# Patient Record
Sex: Male | Born: 1950
Health system: Southern US, Community
[De-identification: ages and names within clinical notes are randomized; demographics above are authoritative.]

## PROBLEM LIST (undated history)

## (undated) DIAGNOSIS — C801 Malignant (primary) neoplasm, unspecified: Secondary | ICD-10-CM

## (undated) DIAGNOSIS — E785 Hyperlipidemia, unspecified: Secondary | ICD-10-CM

## (undated) DIAGNOSIS — I10 Essential (primary) hypertension: Secondary | ICD-10-CM

## (undated) DIAGNOSIS — K219 Gastro-esophageal reflux disease without esophagitis: Secondary | ICD-10-CM

## (undated) DIAGNOSIS — J189 Pneumonia, unspecified organism: Secondary | ICD-10-CM

## (undated) HISTORY — PX: TONSILLECTOMY AND ADENOIDECTOMY: SUR1326

## (undated) HISTORY — DX: Gastro-esophageal reflux disease without esophagitis: K21.9

## (undated) HISTORY — PX: UPPER GASTROINTESTINAL ENDOSCOPY: SHX188

## (undated) HISTORY — PX: COLONOSCOPY: SHX174

## (undated) HISTORY — DX: Hyperlipidemia, unspecified: E78.5

## (undated) HISTORY — DX: Essential (primary) hypertension: I10

## (undated) HISTORY — DX: Pneumonia, unspecified organism: J18.9

---
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
## 1975-06-02 DIAGNOSIS — J189 Pneumonia, unspecified organism: Secondary | ICD-10-CM

## 1975-06-02 HISTORY — DX: Pneumonia, unspecified organism: J18.9

## 1985-06-01 HISTORY — PX: VASECTOMY: SHX75

## 1999-07-14 ENCOUNTER — Encounter (INDEPENDENT_AMBULATORY_CARE_PROVIDER_SITE_OTHER): Payer: Self-pay

## 1999-07-14 ENCOUNTER — Other Ambulatory Visit: Admission: RE | Admit: 1999-07-14 | Discharge: 1999-07-14 | Payer: Self-pay | Admitting: Gastroenterology

## 1999-08-25 ENCOUNTER — Other Ambulatory Visit: Admission: RE | Admit: 1999-08-25 | Discharge: 1999-08-25 | Payer: Self-pay | Admitting: Gastroenterology

## 1999-08-25 ENCOUNTER — Encounter (INDEPENDENT_AMBULATORY_CARE_PROVIDER_SITE_OTHER): Payer: Self-pay

## 2003-04-06 ENCOUNTER — Encounter: Payer: Self-pay | Admitting: Internal Medicine

## 2004-06-18 ENCOUNTER — Ambulatory Visit: Payer: Self-pay | Admitting: Internal Medicine

## 2004-10-24 ENCOUNTER — Ambulatory Visit: Payer: Self-pay | Admitting: Internal Medicine

## 2004-11-13 ENCOUNTER — Ambulatory Visit: Payer: Self-pay | Admitting: Gastroenterology

## 2004-11-24 ENCOUNTER — Ambulatory Visit: Payer: Self-pay | Admitting: Gastroenterology

## 2005-04-07 ENCOUNTER — Ambulatory Visit: Payer: Self-pay | Admitting: Internal Medicine

## 2005-12-01 ENCOUNTER — Ambulatory Visit: Payer: Self-pay | Admitting: Internal Medicine

## 2006-06-16 ENCOUNTER — Ambulatory Visit: Payer: Self-pay | Admitting: Internal Medicine

## 2006-10-14 DIAGNOSIS — E785 Hyperlipidemia, unspecified: Secondary | ICD-10-CM

## 2006-10-14 DIAGNOSIS — I10 Essential (primary) hypertension: Secondary | ICD-10-CM | POA: Insufficient documentation

## 2006-10-15 ENCOUNTER — Ambulatory Visit: Payer: Self-pay | Admitting: Internal Medicine

## 2006-10-15 ENCOUNTER — Encounter: Payer: Self-pay | Admitting: Internal Medicine

## 2006-10-15 LAB — CONVERTED CEMR LAB
ALT: 33 units/L (ref 0–40)
AST: 29 units/L (ref 0–37)
BUN: 18 mg/dL (ref 6–23)
Cholesterol: 163 mg/dL (ref 0–200)
HDL: 43.9 mg/dL (ref >39.0)
LDL Cholesterol: 86 mg/dL (ref 0–99)
VLDL: 33 mg/dL (ref 0–40)

## 2006-11-22 ENCOUNTER — Encounter: Payer: Self-pay | Admitting: Internal Medicine

## 2007-01-10 ENCOUNTER — Ambulatory Visit: Payer: Self-pay | Admitting: Internal Medicine

## 2007-01-10 DIAGNOSIS — L719 Rosacea, unspecified: Secondary | ICD-10-CM

## 2007-01-10 DIAGNOSIS — H02849 Edema of unspecified eye, unspecified eyelid: Secondary | ICD-10-CM

## 2007-01-15 LAB — CONVERTED CEMR LAB
Free T4: 0.8 ng/dL
T3, Free: 2.7 pg/mL
TSH: 0.97 u[IU]/mL

## 2007-01-17 ENCOUNTER — Encounter (INDEPENDENT_AMBULATORY_CARE_PROVIDER_SITE_OTHER): Payer: Self-pay | Admitting: *Deleted

## 2007-02-22 ENCOUNTER — Ambulatory Visit: Payer: Self-pay | Admitting: Internal Medicine

## 2007-02-24 LAB — CONVERTED CEMR LAB
Basophils Absolute: 0 10*3/uL (ref 0.0–0.1)
Eosinophils Absolute: 0.2 10*3/uL (ref 0.0–0.6)
Eosinophils Relative: 2.3 % (ref 0.0–5.0)
HCT: 40.4 % (ref 39.0–52.0)
Lymphocytes Relative: 19.5 % (ref 12.0–46.0)
MCHC: 34.3 g/dL (ref 30.0–36.0)
MCV: 88.7 fL (ref 78.0–100.0)
Monocytes Absolute: 0.6 10*3/uL (ref 0.2–0.7)
Neutro Abs: 6.5 10*3/uL (ref 1.4–7.7)
Neutrophils Relative %: 70.6 % (ref 43.0–77.0)
RBC: 4.55 M/uL (ref 4.22–5.81)
WBC: 9.1 10*3/uL (ref 4.5–10.5)

## 2007-02-25 ENCOUNTER — Encounter: Payer: Self-pay | Admitting: Internal Medicine

## 2007-02-25 ENCOUNTER — Encounter (INDEPENDENT_AMBULATORY_CARE_PROVIDER_SITE_OTHER): Payer: Self-pay | Admitting: *Deleted

## 2007-03-07 ENCOUNTER — Telehealth (INDEPENDENT_AMBULATORY_CARE_PROVIDER_SITE_OTHER): Payer: Self-pay | Admitting: *Deleted

## 2007-04-07 ENCOUNTER — Encounter: Payer: Self-pay | Admitting: Internal Medicine

## 2007-05-06 ENCOUNTER — Encounter: Payer: Self-pay | Admitting: Internal Medicine

## 2007-06-28 ENCOUNTER — Ambulatory Visit: Payer: Self-pay | Admitting: Internal Medicine

## 2007-06-28 DIAGNOSIS — T783XXA Angioneurotic edema, initial encounter: Secondary | ICD-10-CM | POA: Insufficient documentation

## 2007-07-02 LAB — CONVERTED CEMR LAB
Basophils Absolute: 0.1 10*3/uL (ref 0.0–0.1)
Basophils Relative: 0.8 % (ref 0.0–1.0)
Complement C4, Body Fluid: 22 mg/dL (ref 16–47)
Eosinophils Absolute: 0.3 10*3/uL (ref 0.0–0.6)
Hemoglobin: 13.8 g/dL (ref 13.0–17.0)
Lymphocytes Relative: 22.2 % (ref 12.0–46.0)
MCHC: 33.5 g/dL (ref 30.0–36.0)
MCV: 86.3 fL (ref 78.0–100.0)
Monocytes Absolute: 0.7 10*3/uL (ref 0.2–0.7)
Monocytes Relative: 9.3 % (ref 3.0–11.0)
Neutro Abs: 5 10*3/uL (ref 1.4–7.7)
Platelets: 344 10*3/uL (ref 150–400)
Sed Rate: 8 mm/hr (ref 0–20)

## 2007-07-04 ENCOUNTER — Encounter (INDEPENDENT_AMBULATORY_CARE_PROVIDER_SITE_OTHER): Payer: Self-pay | Admitting: *Deleted

## 2007-07-05 ENCOUNTER — Ambulatory Visit: Payer: Self-pay | Admitting: Cardiovascular Disease

## 2007-07-07 ENCOUNTER — Encounter (INDEPENDENT_AMBULATORY_CARE_PROVIDER_SITE_OTHER): Payer: Self-pay | Admitting: *Deleted

## 2007-08-16 ENCOUNTER — Encounter: Payer: Self-pay | Admitting: Internal Medicine

## 2008-01-18 ENCOUNTER — Telehealth (INDEPENDENT_AMBULATORY_CARE_PROVIDER_SITE_OTHER): Payer: Self-pay | Admitting: *Deleted

## 2008-06-11 ENCOUNTER — Telehealth (INDEPENDENT_AMBULATORY_CARE_PROVIDER_SITE_OTHER): Payer: Self-pay | Admitting: *Deleted

## 2008-09-25 ENCOUNTER — Telehealth (INDEPENDENT_AMBULATORY_CARE_PROVIDER_SITE_OTHER): Payer: Self-pay | Admitting: *Deleted

## 2008-09-28 ENCOUNTER — Emergency Department (HOSPITAL_COMMUNITY): Admission: EM | Admit: 2008-09-28 | Discharge: 2008-09-28 | Payer: Self-pay | Admitting: Emergency Medicine

## 2008-10-16 ENCOUNTER — Ambulatory Visit: Payer: Self-pay | Admitting: Internal Medicine

## 2008-12-28 ENCOUNTER — Telehealth (INDEPENDENT_AMBULATORY_CARE_PROVIDER_SITE_OTHER): Payer: Self-pay | Admitting: *Deleted

## 2009-01-08 ENCOUNTER — Ambulatory Visit: Payer: Self-pay | Admitting: Internal Medicine

## 2009-01-11 ENCOUNTER — Ambulatory Visit: Payer: Self-pay | Admitting: Internal Medicine

## 2009-01-11 DIAGNOSIS — T887XXA Unspecified adverse effect of drug or medicament, initial encounter: Secondary | ICD-10-CM | POA: Insufficient documentation

## 2009-01-14 ENCOUNTER — Telehealth (INDEPENDENT_AMBULATORY_CARE_PROVIDER_SITE_OTHER): Payer: Self-pay | Admitting: *Deleted

## 2009-01-15 ENCOUNTER — Encounter (INDEPENDENT_AMBULATORY_CARE_PROVIDER_SITE_OTHER): Payer: Self-pay | Admitting: *Deleted

## 2009-07-11 IMAGING — CR DG FOOT COMPLETE 3+V*R*
3 series · 3 of 3 positions shown · non-contrast
Comparison: None.

CLINICAL DATA: Fell off ladder.  Foot pain.

RIGHT FOOT COMPLETE - 3+ VIEW

[t foot ap right]
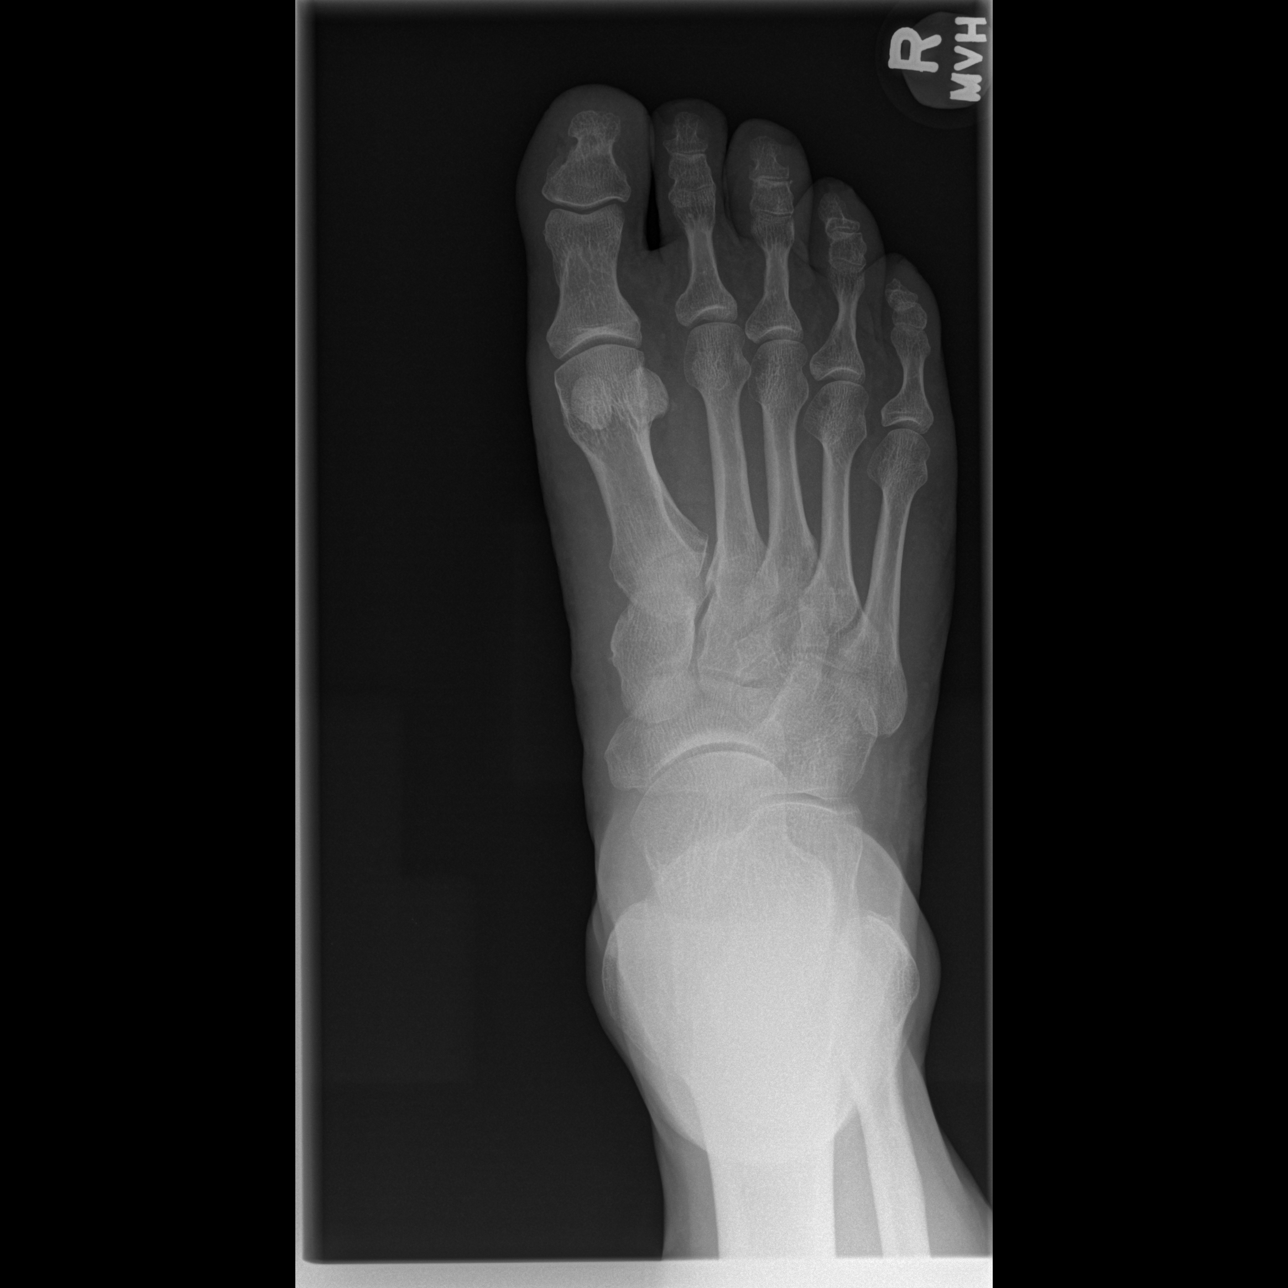

[t foot oblique right]
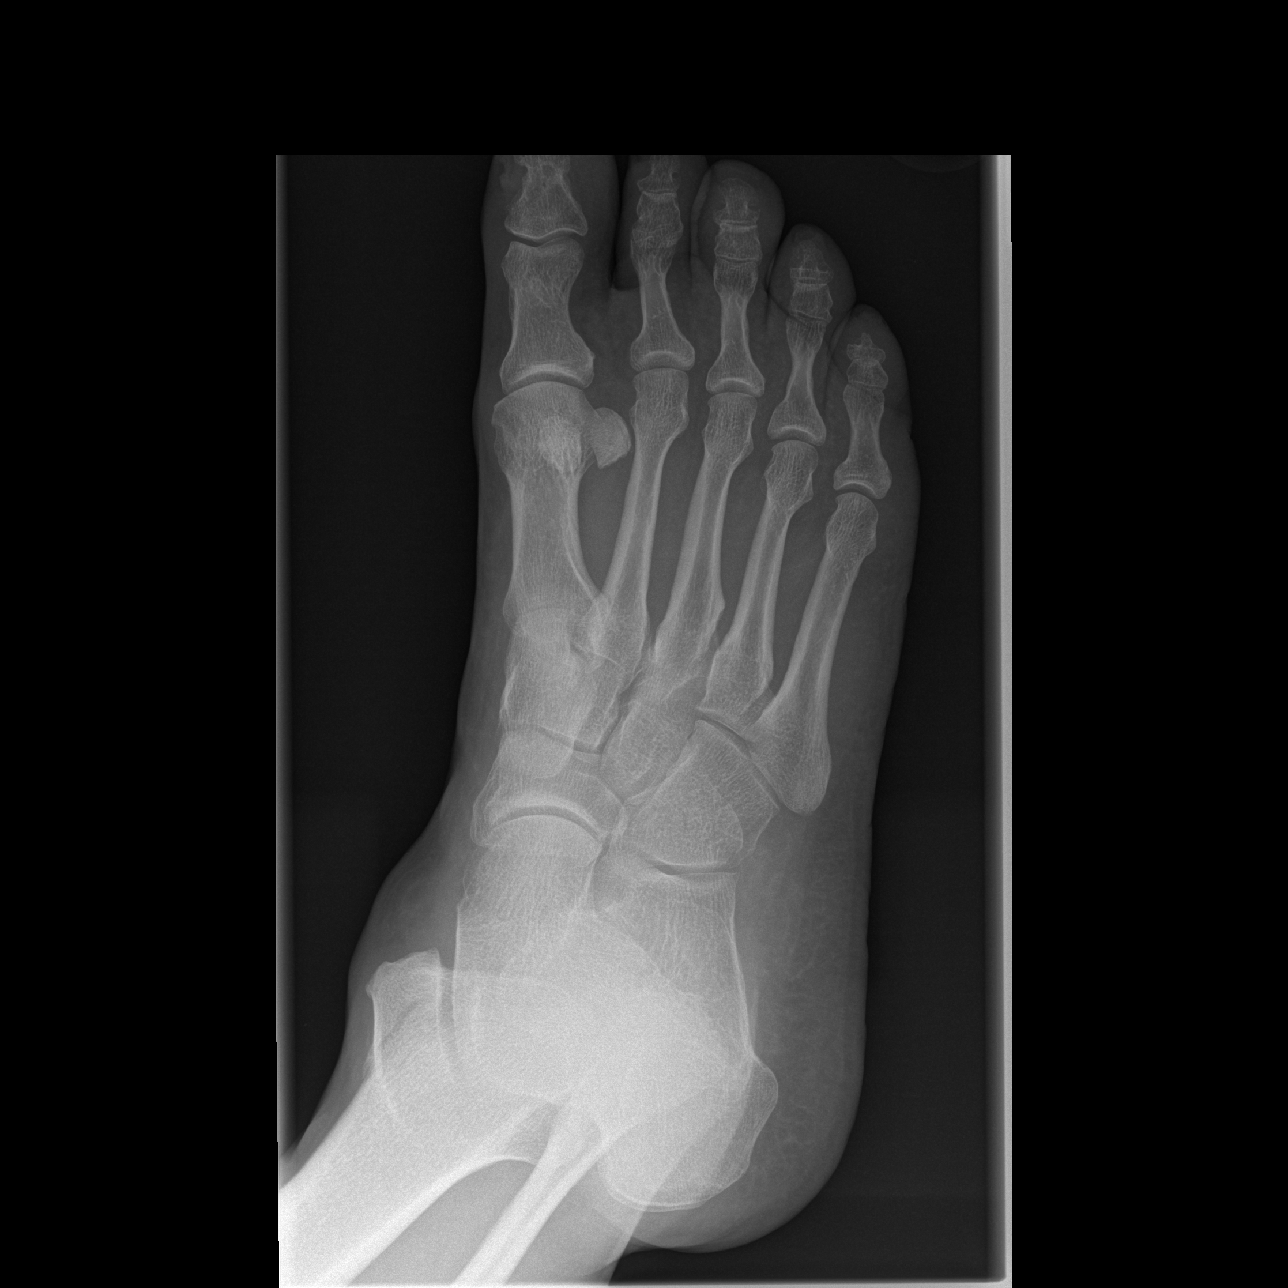

[t foot lat right]
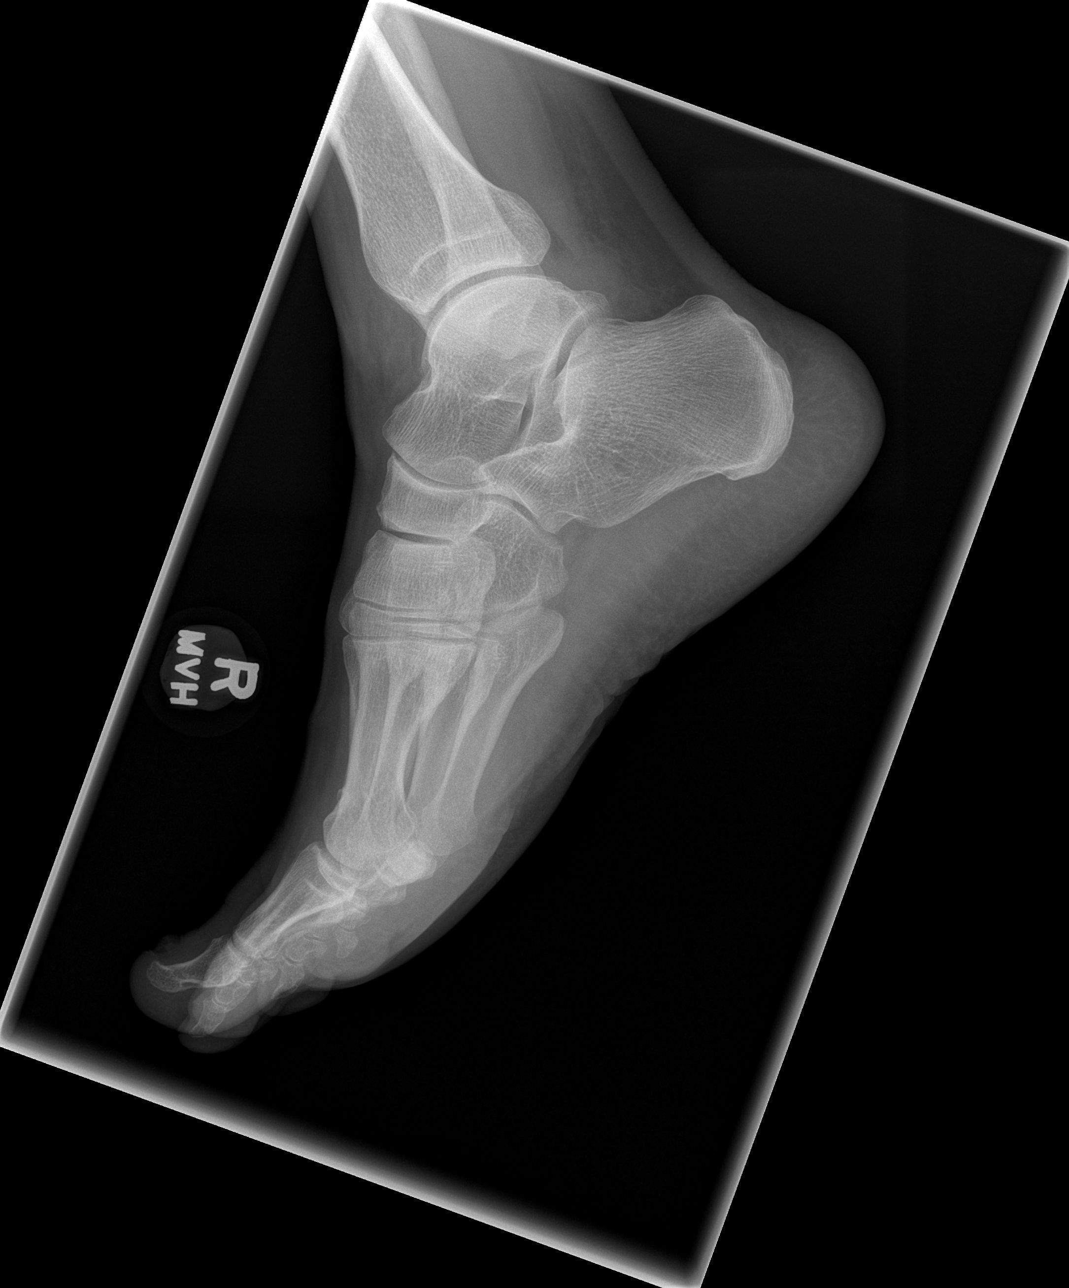

[3 of 3 positions shown; findings below may reference images not displayed]

FINDINGS: No evidence for fracture.  No subluxation or dislocation.
No lytic or sclerotic osseous lesion.  Linear lucency overlies the
medial malleolus, but this is only seen on one of the three views.
IMPRESSION: No definite acute bony abnormality.

Linear lucency over the medial malleolus on the oblique film.  If
there is concern for ankle fracture, dedicated ankle films
recommended.

## 2010-04-03 ENCOUNTER — Encounter: Payer: Self-pay | Admitting: Internal Medicine

## 2010-04-09 ENCOUNTER — Encounter: Payer: Self-pay | Admitting: Internal Medicine

## 2010-04-09 ENCOUNTER — Ambulatory Visit: Payer: Self-pay | Admitting: Internal Medicine

## 2010-04-14 LAB — CONVERTED CEMR LAB
ALT: 21 units/L (ref 0–53)
AST: 23 units/L (ref 0–37)
Albumin: 4.1 g/dL (ref 3.5–5.2)
Alkaline Phosphatase: 60 units/L (ref 39–117)
Basophils Relative: 0.5 % (ref 0.0–3.0)
CO2: 29 meq/L (ref 19–32)
Calcium: 9.7 mg/dL (ref 8.4–10.5)
Chloride: 105 meq/L (ref 96–112)
Eosinophils Absolute: 0.2 10*3/uL (ref 0.0–0.7)
Eosinophils Relative: 3.6 % (ref 0.0–5.0)
Hemoglobin: 13.7 g/dL (ref 13.0–17.0)
Lymphocytes Relative: 31.1 % (ref 12.0–46.0)
MCHC: 34 g/dL (ref 30.0–36.0)
Neutro Abs: 3.3 10*3/uL (ref 1.4–7.7)
RBC: 4.56 M/uL (ref 4.22–5.81)
Sodium: 142 meq/L (ref 135–145)
Total CHOL/HDL Ratio: 6
Total Protein: 6.5 g/dL (ref 6.0–8.3)
Triglycerides: 204 mg/dL — ABNORMAL HIGH (ref 0.0–149.0)
WBC: 5.4 10*3/uL (ref 4.5–10.5)

## 2010-06-10 ENCOUNTER — Telehealth: Payer: Self-pay | Admitting: Internal Medicine

## 2010-06-29 LAB — CONVERTED CEMR LAB
Albumin: 4 g/dL (ref 3.5–5.2)
BUN: 20 mg/dL (ref 6–23)
HDL goal, serum: 40 mg/dL
Potassium: 5.1 meq/L (ref 3.5–5.1)
Total Protein: 6.7 g/dL (ref 6.0–8.3)

## 2010-07-01 NOTE — Assessment & Plan Note (Signed)
Summary: MR5 /MED REFILL//PH--will come in fasting///sph   Vital Signs:  Patient profile:   60 year old male Height:      67 inches Weight:      214 pounds BMI:     33.64 Temp:     98.5 degrees F oral Pulse rate:   60 / minute Resp:     15 per minute BP sitting:   120 / 88  (left arm) Cuff size:   large  Vitals Entered By: Shonna Chock CMA (April 09, 2010 2:02 PM) CC: cpx with fasting labs , Lipid Management Comments Refused to update vaccines   CC:  cpx with fasting labs  and Lipid Management.  History of Present Illness:      Albert Walls is here for a physical; he continues to have intermittent angioedema of eyelids. He has seen Dr Campbell Stall , Dermatologist & Dr Stevphen Rochester , Allergist for this issue. He has responded well to steroids orally , but this caused mood swings. Hyperlipidemia Follow-Up     He has been off a statin for ? 2 years because of the facial edema. Stopping that & ASA made no difference .He restarted baby ASA 07/2009 w/o exacerbation in symptoms.  The patient denies the following symptoms: chest pain/pressure, exercise intolerance, dypsnea, palpitations, syncope, and pedal edema.  Dietary compliance has been fair.  The patient reports exercising 2-4X per week.  Adjunctive measures currently used by the patient include ASA.   Hypertension Follow-Up        The patient denies lightheadedness, urinary frequency, headaches, and fatigue.  Compliance with Beta bloicker medications (by patient report) has been near 100%.  Adjunctive measures currently used by the patient include salt restriction.  BP @ home is 120-135/78-90.  Lipid Management History:      Positive NCEP/ATP III risk factors include male age 66 years old or older and hypertension.  Negative NCEP/ATP III risk factors include non-diabetic, no family history for ischemic heart disease, non-tobacco-user status, no ASHD (atherosclerotic heart disease), no prior stroke/TIA, no peripheral vascular disease,  and no history of aortic aneurysm.     Current Medications (verified): 1)  Ecasa 81 Mg 2)  Metoprolol Tartrate 25 Mg Tabs (Metoprolol Tartrate) .Marland Kitchen.. 1 By Mouth Qd  Allergies (verified): No Known Drug Allergies  Past History:  Past Medical History: Hyperlipidemia: Fr[place]ham Study LDL goal = < 130. NMR Lipoprofile 2010: LDL 136(2851/2125), HDL 38, TG 288. Hypertension Rosacea, Dr Danella Deis Edema, eyelids & maxillae  Past Surgical History: Colonoscopy 2001& 2006 negative, due 2013; Endo : esophagitis 2001, Dr Arlyce Dice Tonsillectomy  Family History: no MI;P aunt: CVA in her 23s P uncle: testicular & colon cancer; PG uncle: DM; PGGF : colon cancer Father: neg Mother: neg Siblings:none   Social History: Never Smoked Alcohol use-yes: socially Regular exercise-yes Married Occupation:Engineer  Review of Systems  The patient denies anorexia, fever, weight loss, weight gain, vision loss, decreased hearing, hoarseness, abdominal pain, melena, severe indigestion/heartburn, unusual weight change, abnormal bleeding, and enlarged lymph nodes.         Intermittent hemorrhoidal bleeding Resp:  Denies cough, shortness of breath, sputum productive, and wheezing. MS:  Complains of joint pain; denies joint redness, joint swelling, low back pain, mid back pain, and thoracic pain; 5th L DIP tender; pain R knee with stairs. Rx: none. Allergy:  Denies itching eyes, seasonal allergies, and sneezing.  Physical Exam  General:  well-nourished,in no acute distress; alert,appropriate and cooperative throughout examination Head:  Normocephalic and atraumatic  without obvious abnormalities. No apparent alopecia  Eyes:  No corneal or conjunctival inflammation noted. EOMI. Perrla. Funduscopic exam benign, without hemorrhages, exudates or papilledema. Lid edema Ears:  External ear exam shows no significant lesions or deformities.  Otoscopic examination reveals clear canals, tympanic membranes are intact  bilaterally without bulging, retraction, inflammation or discharge. Hearing is grossly normal bilaterally. Nose:  External nasal examination shows no deformity or inflammation. Nasal mucosa are pink and moist without lesions or exudates. Mouth:  Oral mucosa and oropharynx without lesions or exudates.  Teeth in good repair. Neck:  No deformities, masses, or tenderness noted. Breasts:  Mild fibrocystic changes Lungs:  Normal respiratory effort, chest expands symmetrically. Lungs are clear to auscultation, no crackles or wheezes. Heart:  regular rhythm, no murmur, no gallop, no rub, no JVD, no HJR, and bradycardia.   S4 Abdomen:  Bowel sounds positive,abdomen soft and non-tender without masses, organomegaly or hernias noted. Rectal:  No external abnormalities noted. Normal sphincter tone. No rectal masses or tenderness. Genitalia:  Testes bilaterally descended without nodularity, tenderness or masses. No scrotal masses or lesions. No penis lesions or urethral discharge. L varicocele.   Prostate:  Prostate gland firm and smooth, no enlargement, nodularity, tenderness, mass, asymmetry or induration. Msk:  No deformity or scoliosis noted of thoracic or lumbar spine.   Pulses:  R and L carotid,radial,dorsalis pedis and posterior tibial pulses are full and equal bilaterally Extremities:  No clubbing, cyanosis, edema, or significant  deformity noted with normal full range of motion of all joints.  Minor DIP OA changes 5th digits Neurologic:  alert & oriented X3, gait normal, and DTRs symmetrical and normal.   Skin:  Facial Rosacea maxillary area.Biopsy site R shoulder Cervical Nodes:  No lymphadenopathy noted Axillary Nodes:  No palpable lymphadenopathy Inguinal Nodes:  No significant adenopathy Psych:  memory intact for recent and remote, normally interactive, and good eye contact.     Impression & Recommendations:  Problem # 1:  ROUTINE GENERAL MEDICAL EXAM@HEALTH  CARE FACL  (ICD-V70.0)  Orders: EKG w/ Interpretation (93000) Venipuncture (16109) TLB-Lipid Panel (80061-LIPID) TLB-BMP (Basic Metabolic Panel-BMET) (80048-METABOL) TLB-CBC Platelet - w/Differential (85025-CBCD) TLB-Hepatic/Liver Function Pnl (80076-HEPATIC) TLB-TSH (Thyroid Stimulating Hormone) (84443-TSH) Specimen Handling (60454)  Problem # 2:  HYPERTENSION (ICD-401.9)  The following medications were removed from the medication list:    Metoprolol Succinate 25 Mg Tb24 (Metoprolol succinate) .Marland Kitchen... Take 1 tab once daily His updated medication list for this problem includes:    Metoprolol Tartrate 25 Mg Tabs (Metoprolol tartrate) .Marland Kitchen... 1 by mouth qd  Problem # 3:  HYPERLIPIDEMIA (ICD-272.4)  His updated medication list for this problem includes:    Metoprolol Succinate 25 Mg Tb24 (Metoprolol succinate) .Marland Kitchen... Take 1 tab once daily  Problem # 4:  EDEMA, EYELID (ICD-374.82)  Problem # 5:  ROSACEA (ICD-695.3) as per Dr Danella Deis  Complete Medication List: 1)  Marlowe Kays 81 Mg  2)  Metoprolol Tartrate 25 Mg Tabs (Metoprolol tartrate) .Marland Kitchen.. 1 by mouth qd  Lipid Assessment/Plan:      Based on NCEP/ATP III, the patient's risk factor category is "0-1 risk factors".  The patient's lipid goals are as follows: Total cholesterol goal is 200; LDL cholesterol goal is 150; HDL cholesterol goal is 40; Triglyceride goal is 150.  His LDL cholesterol goal has been met.    Patient Instructions: 1)  To ER for lip or tongue swelling as discussed. 2)  Check your Blood Pressure regularly. If it is above: 135/85 ON AVERAGE you should make an  appointment. Prescriptions: METOPROLOL TARTRATE 25 MG TABS (METOPROLOL TARTRATE) 1 by mouth qd  #90 x 3   Entered by:   Lucious Groves CMA   Authorized by:   Marga Melnick MD   Signed by:   Lucious Groves CMA on 04/09/2010   Method used:   Print then Give to Patient   RxID:   [id number] METOPROLOL TARTRATE 25 MG TABS (METOPROLOL TARTRATE) 1 by mouth qd  #90 x 3   Entered by:    Lucious Groves CMA   Authorized by:   Marga Melnick MD   Signed by:   Lucious Groves CMA on 04/09/2010   Method used:   Print then Give to Patient   RxID:   [id number] METOPROLOL TARTRATE 25 MG TABS (METOPROLOL TARTRATE) 1 by mouth qd  #90 x 3   Entered by:   Cristy Hilts, RN   Authorized by:   Marga Melnick MD   Signed by:   Cristy Hilts, RN on 04/09/2010   Method used:   Reprint   RxID:   [id number] METOPROLOL TARTRATE 25 MG TABS (METOPROLOL TARTRATE) 1 by mouth qd  #90 x 3   Entered by:   Lucious Groves CMA   Authorized by:   Marga Melnick MD   Signed by:   Lucious Groves CMA on 04/09/2010   Method used:   Print then Give to Patient   RxID:   [id number] METOPROLOL TARTRATE 25 MG TABS (METOPROLOL TARTRATE) 1 by mouth qd  #90 x 3   Entered by:   Lucious Groves CMA   Authorized by:   Marga Melnick MD   Signed by:   Lucious Groves CMA on 04/09/2010   Method used:   Print then Give to Patient   RxID:   [id number] METOPROLOL SUCCINATE 25 MG  TB24 (METOPROLOL SUCCINATE) take 1 tab once daily  #90 x 3   Entered by:   Lucious Groves CMA   Authorized by:   Marga Melnick MD   Signed by:   Lucious Groves CMA on 04/09/2010   Method used:   Print then Give to Patient   RxID:   [id number] METOPROLOL SUCCINATE 25 MG  TB24 (METOPROLOL SUCCINATE) take 1 tab once daily  #90 x 3   Entered and Authorized by:   Marga Melnick MD   Signed by:   Marga Melnick MD on 04/09/2010   Method used:   Print then Give to Patient   RxID:   [id number] METOPROLOL SUCCINATE 25 MG  TB24 (METOPROLOL SUCCINATE) take 1 tab once daily  #90 x 3   Entered and Authorized by:   Marga Melnick MD   Signed by:   Marga Melnick MD on 04/09/2010   Method used:   Print then Give to Patient   RxID:   [id number]    Orders Added: 1)  Est. Patient 40-64 years [99396] 2)  EKG w/ Interpretation [93000] 3)  Venipuncture [36415] 4)  TLB-Lipid Panel [80061-LIPID] 5)  TLB-BMP (Basic Metabolic  Panel-BMET) [80048-METABOL] 6)  TLB-CBC Platelet - w/Differential [85025-CBCD] 7)  TLB-Hepatic/Liver Function Pnl [80076-HEPATIC] 8)  TLB-TSH (Thyroid Stimulating Hormone) [84443-TSH] 9)  Specimen Handling [99000]

## 2010-07-03 NOTE — Progress Notes (Signed)
Summary: Metoprolol  Phone Note Call from Patient Call back at Work Phone [phone]   Summary of Call: Prior to CPX the patient was on Metoprolol Succ. and now noticed that he was given Metoprolol Tartrate. He would like to confirm which med he should be taking and why. Please advise. Initial call taken by: Lucious Groves CMA,  June 10, 2010 12:58 PM  Follow-up for Phone Call        Per MD: There was a back order on the once daily dosing form so two times a day form had to be given. It is ok to take once daily if available. Patient notes that he has 1/2 bottle of the Tartrate form left and will take it until finished. Lucious Groves CMA  June 10, 2010 4:17 PM

## 2011-05-08 ENCOUNTER — Other Ambulatory Visit (INDEPENDENT_AMBULATORY_CARE_PROVIDER_SITE_OTHER): Payer: BC Managed Care – PPO

## 2011-05-08 DIAGNOSIS — E785 Hyperlipidemia, unspecified: Secondary | ICD-10-CM

## 2011-05-08 DIAGNOSIS — I10 Essential (primary) hypertension: Secondary | ICD-10-CM

## 2011-05-08 DIAGNOSIS — Z0000 Encounter for general adult medical examination without abnormal findings: Secondary | ICD-10-CM

## 2011-05-08 DIAGNOSIS — T887XXA Unspecified adverse effect of drug or medicament, initial encounter: Secondary | ICD-10-CM

## 2011-05-08 LAB — CBC WITH DIFFERENTIAL/PLATELET
Basophils Relative: 0.5 % (ref 0.0–3.0)
Eosinophils Absolute: 0.3 10*3/uL (ref 0.0–0.7)
MCHC: 33.7 g/dL (ref 30.0–36.0)
MCV: 90.6 fl (ref 78.0–100.0)
Monocytes Absolute: 0.6 10*3/uL (ref 0.1–1.0)
Neutrophils Relative %: 61.4 % (ref 43.0–77.0)
Platelets: 286 10*3/uL (ref 150.0–400.0)
RBC: 4.83 Mil/uL (ref 4.22–5.81)

## 2011-05-08 LAB — BASIC METABOLIC PANEL
BUN: 15 mg/dL (ref 6–23)
CO2: 31 mEq/L (ref 19–32)
Chloride: 104 mEq/L (ref 96–112)
Creatinine, Ser: 1 mg/dL (ref 0.4–1.5)
Glucose, Bld: 95 mg/dL (ref 70–99)

## 2011-05-08 LAB — LDL CHOLESTEROL, DIRECT: Direct LDL: 159.6 mg/dL

## 2011-05-08 LAB — HEPATIC FUNCTION PANEL
Alkaline Phosphatase: 65 U/L (ref 39–117)
Bilirubin, Direct: 0 mg/dL (ref 0.0–0.3)

## 2011-05-08 LAB — LIPID PANEL
Total CHOL/HDL Ratio: 5
VLDL: 43.8 mg/dL — ABNORMAL HIGH (ref 0.0–40.0)

## 2011-05-08 NOTE — Progress Notes (Signed)
12  

## 2011-05-12 ENCOUNTER — Other Ambulatory Visit: Payer: Self-pay

## 2011-05-19 ENCOUNTER — Encounter: Payer: Self-pay | Admitting: Internal Medicine

## 2011-05-19 ENCOUNTER — Ambulatory Visit (INDEPENDENT_AMBULATORY_CARE_PROVIDER_SITE_OTHER): Payer: BC Managed Care – PPO | Admitting: Internal Medicine

## 2011-05-19 VITALS — BP 128/80 | HR 63 | Temp 98.6°F | Resp 12 | Ht 66.5 in | Wt 208.6 lb

## 2011-05-19 DIAGNOSIS — Z0000 Encounter for general adult medical examination without abnormal findings: Secondary | ICD-10-CM

## 2011-05-19 DIAGNOSIS — E785 Hyperlipidemia, unspecified: Secondary | ICD-10-CM

## 2011-05-19 NOTE — Patient Instructions (Signed)
Preventive Health Care: Exercise at least 30-45 minutes a day,  3-4 days a week.  Eat a low-fat diet with lots of fruits and vegetables, up to 7-9 servings per day. Avoid obesity; your goal is waist measurement < 40 inches.Consume less than 40 grams of sugar per day from foods & drinks with High Fructose Corn Sugar as #2,3 or # 4 on label. Please  schedule fasting Labs after 4 months of nutrition changes : NMR Lipoprofile Lipid Panel ;Testosterone level . PLEASE BRING THESE INSTRUCTIONS TO FOLLOW UP  LAB APPOINTMENT.This will guarantee correct labs are drawn, eliminating need for repeat blood sampling ( needle sticks ! ). Diagnoses /Codes: 272.4, decreased libido.

## 2011-05-19 NOTE — Progress Notes (Signed)
Subjective:    Patient ID: Albert Walls, male    DOB: 10-07-1950, 60 y.o.   MRN: [id number]  HPI  Mr Ballon is here for a physical;acute issues include an active respiratory infection      Review of Systems Respiratory tract infection Onset/symptoms:05/11/11 as ST  Exposures (illness/environmental/extrinsic):wife ill Progression of symptoms:head congestion 24 hrs later Treatments/response:Tylenol Cold, Emergency / some benefit Present symptoms: Fever/chills/sweats:no except sweat 12/13 while in [place] . Fine back from [place] he had some otic barotrauma related to  flying Frontal headache:no Facial pain:no Nasal purulence:not since 12/16 Sore throat:slight Dental pain:no Lymphadenopathy:last week Wheezing/shortness of breath:no Cough/sputum/hemoptysis:NP cough Associated extrinsic/allergic symptoms:itchy eyes/ sneezing:no Past medical history: Seasonal allergies/asthma:no Smoking history:never  Remotely he had low testosterone for which he took injections for a short period of time. He describes decreased libido           Objective:   Physical Exam Gen.: Healthy and well-nourished in appearance. Alert, appropriate and cooperative throughout exam. Head: Normocephalic without obvious abnormalities  Eyes: No corneal or conjunctival inflammation noted; but lids slightly edematous. Pupils equal round reactive to light and accommodation. Fundal exam is benign without hemorrhages, exudate, papilledema. Extraocular motion intact. Vision grossly normal. Ears: External  ear exam reveals no significant lesions or deformities. Canals clear .TMs normal. Hearing is grossly decreased on L to whisper @ 6 ft Nose: External nasal exam reveals no deformity or inflammation. Nasal mucosa are pink and moist. No lesions or exudates noted.  Mouth: Oral mucosa and oropharynx reveal no lesions or exudates. Teeth in good repair. Neck: No deformities, masses, or tenderness noted. Range of motion &  . Thyroid normal. Lungs: Normal respiratory effort; chest expands symmetrically. Lungs are clear to auscultation without rales, wheezes, or increased work of breathing. Heart: Normal rate and rhythm. Normal S1 and S2. No gallop, click, or rub. S 4 w/o  murmur. Abdomen: Bowel sounds normal; abdomen soft and nontender. No masses, organomegaly or hernias noted. Genitalia/ DRE: Genital exam is unremarkable. Prostate is normal without enlargement, nodularity, asymmetry, or induration. (Note: PSAs have been extremely low in the past, less than 1)   .                                                                                   Musculoskeletal/extremities: No deformity or scoliosis noted of  the thoracic or lumbar spine. No clubbing, cyanosis, edema, or deformity noted. Range of motion  normal .Tone & strength  normal.Joints normal. Nail health  good. Vascular: Carotid, radial artery, dorsalis pedis and  posterior tibial pulses are full and equal. No bruits present. Neurologic: Alert and oriented x3. Deep tendon reflexes symmetrical and normal.          Skin: Intact without suspicious lesions or rashes. Lymph: No cervical, axillary, or inguinal lymphadenopathy present. Psych: Mood and affect are normal. Normally interactive  Assessment & Plan:   #1 comprehensive physical exam; no acute findings #2 see Problem List with Assessments & Recommendations  #3 bronchitis which he feels is resolving. I would recommend using Mucinex DM as needed. He should call if he has fever purulent secretions  #4 past history of low testosterone. Decreased libido at this time. I do recommend an early morning testosterone level he wants to pursue this. This can be done in 4 months when he has followup lipids. Plan: see Orders

## 2011-05-19 NOTE — Assessment & Plan Note (Signed)
Triglycerides have increased from 164 in 2008 to a value of 219. Dietary intervention was recommended with repeat fasting lipids after 4 months. If possible medications should be avoided because of his history of angioedema.

## 2011-06-02 HISTORY — PX: COLONOSCOPY: SHX174

## 2011-06-21 ENCOUNTER — Other Ambulatory Visit: Payer: Self-pay | Admitting: Internal Medicine

## 2011-09-15 ENCOUNTER — Encounter: Payer: Self-pay | Admitting: Gastroenterology

## 2012-01-27 ENCOUNTER — Other Ambulatory Visit: Payer: Self-pay | Admitting: Family Medicine

## 2012-01-27 ENCOUNTER — Encounter: Payer: Self-pay | Admitting: Family Medicine

## 2012-01-27 ENCOUNTER — Ambulatory Visit: Payer: BC Managed Care – PPO | Admitting: Family Medicine

## 2012-01-27 VITALS — BP 178/94 | HR 67 | Temp 98.3°F | Resp 17 | Ht 68.0 in | Wt 216.0 lb

## 2012-01-27 DIAGNOSIS — L0100 Impetigo, unspecified: Secondary | ICD-10-CM

## 2012-01-27 DIAGNOSIS — IMO0002 Reserved for concepts with insufficient information to code with codable children: Secondary | ICD-10-CM

## 2012-01-27 DIAGNOSIS — S61209A Unspecified open wound of unspecified finger without damage to nail, initial encounter: Secondary | ICD-10-CM

## 2012-01-27 MED ORDER — MUPIROCIN 2 % EX OINT: TOPICAL_OINTMENT | Freq: Three times a day (TID) | CUTANEOUS | Status: AC

## 2012-01-27 MED ORDER — DOXYCYCLINE HYCLATE 100 MG PO TABS: 100.0000 mg | ORAL_TABLET | Freq: Two times a day (BID) | ORAL | Status: AC

## 2012-01-27 NOTE — Progress Notes (Signed)
Subjective: 61 year old man who stuck himself with is bladder couple weeks ago in his right thumb at the base of the nail. He thinks he got most of it out. It did seem to for strep some after that. Then he smashed the finger in the same place. It is continued to be inflamed. He went on vacation 2 weeks go to the beach for a week and it did not resolve. He continues to be ran with a tiny bit of drainage around the nail plate.  In addition to that he's developed a couple of skin sores, one on the back of the right handand one below the lower lip. He was with his grandchildren, who got some similar places that they were not sure whetherthey were impetigo or not..  Objective: impetiginous looking sore below her lower lip and on back of right hand. At the base of the right thumbnail there is a paronychia with a small amount of pus to be squeezed out from from the nailbed. Was able to get a culture of that. It looks like a subacute type of infection.  Simple I&D was performed in the most fluctuant area. This was done with ethyl chloride followed by local lidocaine fusion, then asmall I&D. Blood came out but essentially no pus.  Assessment: Paronychia, possibly from foreign body Impetigo  Plan:  doxycycline Bactroban  Return if problems

## 2012-01-27 NOTE — Patient Instructions (Signed)
Use the ointment twice a day on the sores  Take the oral antibiotic twice daily.  If the film is not doing better over the next 5- 6 days come back for recheck.

## 2012-02-02 LAB — WOUND CULTURE

## 2012-04-04 ENCOUNTER — Ambulatory Visit (INDEPENDENT_AMBULATORY_CARE_PROVIDER_SITE_OTHER): Payer: BC Managed Care – PPO | Admitting: Internal Medicine

## 2012-04-04 ENCOUNTER — Encounter: Payer: Self-pay | Admitting: Internal Medicine

## 2012-04-04 ENCOUNTER — Telehealth: Payer: Self-pay | Admitting: Gastroenterology

## 2012-04-04 VITALS — BP 136/88 | HR 79 | Temp 98.2°F | Wt 217.2 lb

## 2012-04-04 DIAGNOSIS — Z8719 Personal history of other diseases of the digestive system: Secondary | ICD-10-CM

## 2012-04-04 DIAGNOSIS — M533 Sacrococcygeal disorders, not elsewhere classified: Secondary | ICD-10-CM

## 2012-04-04 DIAGNOSIS — Z800 Family history of malignant neoplasm of digestive organs: Secondary | ICD-10-CM | POA: Insufficient documentation

## 2012-04-04 DIAGNOSIS — K649 Unspecified hemorrhoids: Secondary | ICD-10-CM

## 2012-04-04 LAB — CBC WITH DIFFERENTIAL/PLATELET
Basophils Relative: 0.4 % (ref 0.0–3.0)
Eosinophils Relative: 1.8 % (ref 0.0–5.0)
Hemoglobin: 14 g/dL (ref 13.0–17.0)
Lymphocytes Relative: 15.1 % (ref 12.0–46.0)
MCV: 88.6 fl (ref 78.0–100.0)
Neutro Abs: 8.5 10*3/uL — ABNORMAL HIGH (ref 1.4–7.7)
Neutrophils Relative %: 76 % (ref 43.0–77.0)
RBC: 4.75 Mil/uL (ref 4.22–5.81)
WBC: 11.2 10*3/uL — ABNORMAL HIGH (ref 4.5–10.5)

## 2012-04-04 MED ORDER — HYDROCORTISONE ACE-PRAMOXINE 1-1 % RE FOAM: 1.0000 | Freq: Two times a day (BID) | RECTAL | Status: DC

## 2012-04-04 NOTE — Progress Notes (Signed)
  Subjective:    Patient ID: Albert Walls, male    DOB: 18-Nov-1950, 61 y.o.   MRN: [id number]  HPI  He has had  blood on the toilet tissue and in the toilet water after having a bowel movement intermittently for 9-12 months. 13 days ago he experienced the pailess production of approximately a teaspoon of blood in the context of a 14 hour air flight. He has noted coccygeal area pain since December 2012 after 13 mile bike ride. This is aggravated by prolonged travel    Review of Systems He specifically denies epistaxis, hemoptysis, melena, hematuria, abnormal bruising or bleeding. There is no family history of clotting disorder. There is a strong family history of colon cancer. His colonoscopy is up-to-date; he is due for followup in 2016.  He's had a scratchy throat since he returned from air travel to [place]. He denies frontal headache, facial pain, nasal purulence, or  fever    Objective:   Physical Exam General appearance is one of good health and nourishment w/o distress.  Eyes: No conjunctival inflammation or scleral icterus is present.  Oral exam: Dental hygiene is good; lips and gums are healthy appearing.There is no oropharyngeal erythema or exudate noted.   Heart:  Normal rate and regular rhythm. S1 and S2 normal without gallop, murmur, click, rub or other extra sounds     Lungs:Chest clear to auscultation; no wheezes, rhonchi,rales ,or rubs present.No increased work of breathing.   Abdomen: bowel sounds normal, soft and non-tender without masses, organomegaly or hernias noted.  No guarding or rebound   Skin:Warm & dry.  Intact without suspicious lesions or rashes ; no jaundice or tenting  Lymphatic: No lymphadenopathy is noted about the head, neck, axilla, or inguinal areas.   Genitourinary: Tenderness in the right scrotum at the site of surgical clip. Small internal hemorrhoids without active bleeding. Hemoccult testing negative. Prostate is normal without enlargement,  nodularity, or asymmetry.              Assessment & Plan:  #1 intermittent rectal bleeding; probably hemorrhoidal. No active bleeding at this time.  #2 coccydynia, post prolonged bike ride. Rubber donut & tub soaks recommended  #3 strong family history of colon cancer  Plan: Hemorrhoidal care. CBC and differential. I'll ask his Gastroenterologist to review this report to see if flexible sigmoidoscopy is indicated at this time.

## 2012-04-04 NOTE — Patient Instructions (Addendum)
Soak your buttocks in Sitz bath twice a day and then apply the cream to the hemorrhoidal tissue. Please employ a rubber donut when traveling for prolonged periods of time to ease pressure on the coccyx. Zicam Melts or Zinc lozenges ; vitamin C 2000 mg daily; & Echinacea for 4-7 days. Report fever, exudate("pus") or progressive pain.   If you activate My Chart; the results can be released to you as soon as they populate from the lab. If you choose not to use this program; the labs have to be reviewed, copied & mailed   causing a delay in getting the results to you.

## 2012-04-05 NOTE — Telephone Encounter (Signed)
Pt saw Dr Alwyn Ren yesterday for rectal bleeding that was worse a few days ago, but is still on the tissue when he wipes. Pt given an appt with Mike Gip, PA in am. Pt stated understanding. Called pt back an left message; his doc appears to be Dr Arlyce Dice, pt that it was Dr Jarold Motto.

## 2012-04-05 NOTE — Telephone Encounter (Signed)
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

## 2012-04-06 ENCOUNTER — Encounter: Payer: Self-pay | Admitting: Gastroenterology

## 2012-04-06 ENCOUNTER — Ambulatory Visit (INDEPENDENT_AMBULATORY_CARE_PROVIDER_SITE_OTHER): Payer: BC Managed Care – PPO | Admitting: Physician Assistant

## 2012-04-06 ENCOUNTER — Encounter: Payer: Self-pay | Admitting: Physician Assistant

## 2012-04-06 VITALS — BP 146/82 | HR 82 | Ht 66.0 in | Wt 215.0 lb

## 2012-04-06 DIAGNOSIS — K625 Hemorrhage of anus and rectum: Secondary | ICD-10-CM

## 2012-04-06 MED ORDER — MOVIPREP 100 G PO SOLR: 1.0000 | Freq: Once | ORAL | Status: DC

## 2012-04-06 NOTE — Patient Instructions (Signed)
You have been scheduled for a colonoscopy with propofol with Dr. Arlyce Dice. Please follow written instructions given to you at your visit today.  Please pick up your prep kit at the pharmacy within the next 1-3 days. If you use inhalers (even only as needed) or a CPAP machine, please bring them with you on the day of your procedure.  Please continue Prctofoam for two weeks then as needed

## 2012-04-07 ENCOUNTER — Encounter: Payer: Self-pay | Admitting: Physician Assistant
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
## 2012-04-07 NOTE — Progress Notes (Signed)
Reviewed and agree with management. [place] D. Denese Mentink, M.D., FACG  

## 2012-04-19 ENCOUNTER — Ambulatory Visit (AMBULATORY_SURGERY_CENTER): Payer: BC Managed Care – PPO | Admitting: Gastroenterology

## 2012-04-19 ENCOUNTER — Encounter: Payer: Self-pay | Admitting: Gastroenterology

## 2012-04-19 VITALS — BP 151/91 | HR 53 | Temp 97.6°F | Resp 14 | Ht 66.0 in | Wt 215.0 lb

## 2012-04-19 DIAGNOSIS — K5730 Diverticulosis of large intestine without perforation or abscess without bleeding: Secondary | ICD-10-CM

## 2012-04-19 DIAGNOSIS — K625 Hemorrhage of anus and rectum: Secondary | ICD-10-CM

## 2012-04-19 MED ORDER — SODIUM CHLORIDE 0.9 % IV SOLN: 500.0000 mL | INTRAVENOUS | Status: DC

## 2012-04-19 NOTE — Patient Instructions (Addendum)

## 2012-04-19 NOTE — Progress Notes (Signed)
Propofol given over incremental dosages 

## 2012-04-19 NOTE — Op Note (Signed)
[facility] [address] [place], [address]   COLONOSCOPY PROCEDURE REPORT  PATIENT: Albert, Walls  MR#: [id number] BIRTHDATE: 15-Feb-1951 , 61  yrs. old GENDER: Male ENDOSCOPIST: Louis Meckel, MD REFERRED BY: PROCEDURE DATE:  04/19/2012 PROCEDURE:   Colonoscopy, diagnostic ASA CLASS:   Class II INDICATIONS:limited rectal bleeding. MEDICATIONS: MAC sedation, administered by CRNA and propofol (Diprivan) 100mg  IV  DESCRIPTION OF PROCEDURE:   After the risks benefits and alternatives of the procedure were thoroughly explained, informed consent was obtained.  A digital rectal exam revealed no abnormalities of the rectum.   The LB [id number]  endoscope was introduced through the anus and advanced to the cecum, which was identified by both the appendix and ileocecal valve. No adverse events experienced.   The quality of the prep was Suprep excellent The instrument was then slowly withdrawn as the colon was fully examined.      COLON FINDINGS: Moderate diverticulosis was noted in the sigmoid colon.   The colon mucosa was otherwise normal.  Retroflexed views revealed no abnormalities. The time to cecum=minutes 0 seconds. Withdrawal time=7 minutes 42 seconds.  The scope was withdrawn and the procedure completed. COMPLICATIONS: There were no complications.  ENDOSCOPIC IMPRESSION: 1.   Moderate diverticulosis was noted in the sigmoid colon 2.   The colon mucosa was otherwise normal  limited rectal bleeding most likely secondary to hemorrhoids  RECOMMENDATIONS: followup colonoscopy 10 years   eSigned:  Louis Meckel, MD 04/19/2012 10:16 AM   cc: Pecola Lawless, MD
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
## 2012-04-20 ENCOUNTER — Telehealth: Payer: Self-pay

## 2012-04-20 NOTE — Telephone Encounter (Signed)
  Follow up Call-  Call back number 04/19/2012  Post procedure Call Back phone  # [phone] or [phone]  Permission to leave phone message Yes     Patient questions:  Do you have a fever, pain , or abdominal swelling? no Pain Score  0 *  Have you tolerated food without any problems? yes  Have you been able to return to your normal activities? yes  Do you have any questions about your discharge instructions: Diet   no Medications  no Follow up visit  no  Do you have questions or concerns about your Care? no  Actions: * If pain score is 4 or above: No action needed, pain <4.

## 2012-05-01 ENCOUNTER — Other Ambulatory Visit: Payer: Self-pay | Admitting: Internal Medicine

## 2012-05-19 ENCOUNTER — Telehealth: Payer: Self-pay | Admitting: Internal Medicine

## 2012-05-19 NOTE — Telephone Encounter (Signed)
Patient states he had 6 inoculations in 1999-2000: Hep A, Typhium vi, Heb B, Hep B #2, Hep B #3, Hep A #2. He frequently travels outside the [place] and would like to know if he needs to have these repeated or if they are good "forever." Please call pt back at work #, ok to leave detailed msg per pt.

## 2012-05-19 NOTE — Telephone Encounter (Signed)
I spoke with patient, to my knowledge vaccines are good for a lifetime, patient was given the number to the [facility] to verify

## 2012-05-19 NOTE — Telephone Encounter (Signed)
The Travel Medicine clinic at [facility] could advise you as to whether any shots are needed; it will depend on travel locale, time a year & presence of epidemic infections

## 2012-05-19 NOTE — Telephone Encounter (Signed)
Please advise      KP 

## 2012-05-31 ENCOUNTER — Ambulatory Visit (INDEPENDENT_AMBULATORY_CARE_PROVIDER_SITE_OTHER): Payer: BC Managed Care – PPO | Admitting: Internal Medicine

## 2012-05-31 ENCOUNTER — Encounter: Payer: Self-pay | Admitting: Internal Medicine

## 2012-05-31 VITALS — BP 134/96 | HR 67 | Temp 98.2°F | Resp 14 | Ht 67.0 in | Wt 219.2 lb

## 2012-05-31 DIAGNOSIS — Z800 Family history of malignant neoplasm of digestive organs: Secondary | ICD-10-CM

## 2012-05-31 DIAGNOSIS — Z0000 Encounter for general adult medical examination without abnormal findings: Secondary | ICD-10-CM

## 2012-05-31 DIAGNOSIS — I10 Essential (primary) hypertension: Secondary | ICD-10-CM

## 2012-05-31 DIAGNOSIS — E785 Hyperlipidemia, unspecified: Secondary | ICD-10-CM

## 2012-05-31 MED ORDER — METOPROLOL SUCCINATE ER 25 MG PO TB24: 50.0000 mg | ORAL_TABLET | Freq: Every day | ORAL | Status: DC

## 2012-05-31 NOTE — Progress Notes (Signed)
  Subjective:    Patient ID: Albert Walls, male    DOB: Oct 03, 1950, 61 y.o.   MRN: [id number]  HPI  Albert Walls is here for a physical; he denies acute issues.      Review of Systems He is on a "moderately " heart healthy diet; he exercises walking 1 mile every other  without symptoms. Specifically he denies chest pain, palpitations, dyspnea, or claudication. Family history is negative for premature coronary disease.  BP @ home typically 138-145/75-88.    Objective:   Physical Exam Gen.:  well-nourished in appearance. Alert, appropriate and cooperative throughout exam. Head: Normocephalic without obvious abnormalities; no alopecia  Eyes: No corneal or conjunctival inflammation noted. Pupils equal round reactive to light and accommodation. Fundal exam is benign without hemorrhages, exudate, papilledema. Extraocular motion intact. Vision grossly normal. Ears: External  ear exam reveals no significant lesions or deformities. Canals clear .TMs normal. Hearing is grossly normal bilaterally. Nose: External nasal exam reveals no deformity or inflammation. Nasal mucosa are pink and moist. No lesions or exudates noted.  Mouth: Oral mucosa and oropharynx reveal no lesions or exudates. Teeth in good repair. Neck: No deformities, masses, or tenderness noted. Range of motion & Thyroid normal. Lungs: Normal respiratory effort; chest expands symmetrically. Lungs are clear to auscultation without rales, wheezes, or increased work of breathing. Heart: Normal rate and rhythm. Normal S1 and S2. No gallop, click, or rub. S4 w/o murmur. Abdomen: Bowel sounds normal; abdomen soft and nontender. No masses, organomegaly or hernias noted. Genitalia: Genitalia normal except for left varices                                                                        Musculoskeletal/extremities: No deformity or scoliosis noted of  the thoracic or lumbar spine. No clubbing, cyanosis, edema, or deformity noted. Range of  motion  normal .Tone & strength  normal.Joints normal. Nail health  good. Vascular: Carotid, radial artery, dorsalis pedis and  posterior tibial pulses are full and equal. No bruits present. Neurologic: Alert and oriented x3. Deep tendon reflexes symmetrical and normal.          Skin: Intact without suspicious lesions or rashes. Lymph: No cervical, axillary, or inguinal lymphadenopathy present. Psych: Mood and affect are normal. Normally interactive                                                                                         Assessment & Plan:  #1 comprehensive physical exam; no acute findings #2 HTn ; goals discussed Plan: see Orders

## 2012-05-31 NOTE — Patient Instructions (Addendum)
Blood Pressure Goal  Ideally is an AVERAGE < 135/85. This AVERAGE should be calculated from @ least 5-7 BP readings taken @ different times of day on different days of week. You should not respond to isolated BP readings , but rather the AVERAGE for that week  The most common cause of elevated triglycerides is the ingestion of sugar from high fructose corn syrup sources added to processed foods & drinks.  Eat a low-fat diet with lots of fruits and vegetables, up to 7-9 servings per day. Consume less than 40 (preferably ZERO) grams of sugar per day from foods & drinks with High Fructose Corn Syrup (HFCS) sugar as #1,2,3 or # 4 on label.Whole Foods, [employer] & [employer] do not carry products with HFCS. Cardiovascular exercise, this can be as simple a program as walking, is recommended 30-45 minutes 3-4 times per week. If you're not exercising you should take 6-8 weeks to build up to this level.   Please  schedule fasting Labs : BMET,Lipids, hepatic panel,  TSH in 4 months after lifestyle changes. PLEASE BRING THESE INSTRUCTIONS TO FOLLOW UP  LAB APPOINTMENT.This will guarantee correct labs are drawn, eliminating need for repeat blood sampling ( needle sticks ! ). Diagnoses /Codes:272.4; 401.9.

## 2012-10-10 ENCOUNTER — Telehealth: Payer: Self-pay | Admitting: Internal Medicine

## 2012-10-10 MED ORDER — METOPROLOL SUCCINATE ER 25 MG PO TB24: 50.0000 mg | ORAL_TABLET | Freq: Every day | ORAL | Status: DC

## 2012-10-10 NOTE — Telephone Encounter (Signed)
Med filled.  

## 2012-10-10 NOTE — Telephone Encounter (Signed)
Pt called to obtain a refill on his metoprolol succinate (TOPROL-XL) 25 MG 24 hr tablet [[id number]] and for it to be sent into [facility]. thanks

## 2012-10-13 ENCOUNTER — Other Ambulatory Visit: Payer: Self-pay | Admitting: General Practice

## 2012-10-13 ENCOUNTER — Telehealth: Payer: Self-pay | Admitting: General Practice

## 2012-10-13 MED ORDER — METOPROLOL TARTRATE 25 MG PO TABS: ORAL_TABLET | ORAL | Status: DC

## 2012-10-13 NOTE — Telephone Encounter (Signed)
Pt called stating that the wrong metoprolol was filled with [facility]. Per last OV on 05/2012, metoprolol tartrate had been canceled and metoprolol succinate has been used. Pt called stating that he is not suppose to be on the metoprolol succinate because he only take 1 pill a day not two. Please advise what medication pt should be on?

## 2012-10-13 NOTE — Telephone Encounter (Signed)
Generic Toprol XL 50 mg once daily or generic metoprolol tartrate 25 mg twice a day; whichever is preferred on his plan.

## 2012-10-13 NOTE — Telephone Encounter (Signed)
Pt advised. Chart updated.

## 2012-12-15 ENCOUNTER — Other Ambulatory Visit: Payer: Self-pay | Admitting: Internal Medicine

## 2013-03-02 ENCOUNTER — Telehealth: Payer: Self-pay | Admitting: *Deleted

## 2013-03-02 NOTE — Telephone Encounter (Signed)
Spoke with pt who states his B/P readings have been averaging 170/90 but he was unsure of which medication he was taking (metoprolol succinate or tartrate) Pt advised to take medication tonight as prescribed and to call me in the morning with the pill bottles in hand to discuss medications. Advised pt that if B/P continued to increase or if he became dizzy, developed a HA or blurred vision to seek medical attention in the ER.

## 2013-03-03 ENCOUNTER — Other Ambulatory Visit: Payer: Self-pay | Admitting: *Deleted

## 2013-03-03 DIAGNOSIS — I10 Essential (primary) hypertension: Secondary | ICD-10-CM

## 2013-03-03 MED ORDER — METOPROLOL SUCCINATE ER 50 MG PO TB24: 50.0000 mg | ORAL_TABLET | Freq: Every day | ORAL | Status: DC

## 2013-03-03 NOTE — Telephone Encounter (Signed)
Rx for Toprol XL 50mg  sent to [facility], this was determined to be the correct medication per phone note from provider on 09/2012

## 2013-06-16 ENCOUNTER — Telehealth: Payer: Self-pay

## 2013-06-16 NOTE — Telephone Encounter (Signed)
Medication List and allergies:  Reviewed and updated  90 day supply/mail order:na Local prescriptions: [facility]  Immunizations due: shingles  A/P:   No changes to FH, PSH or Personal Hx Flu vaccine--01/2013 Tdap--Td--08/2008 CCS--04/2012--Dr Kaplan--next 2023 PSA--not on file  To Discuss with Provider: Who would you recommend for a PCP?

## 2013-06-20 ENCOUNTER — Encounter: Payer: Self-pay | Admitting: Internal Medicine

## 2013-06-20 ENCOUNTER — Ambulatory Visit (INDEPENDENT_AMBULATORY_CARE_PROVIDER_SITE_OTHER): Payer: BC Managed Care – PPO | Admitting: Internal Medicine

## 2013-06-20 ENCOUNTER — Other Ambulatory Visit: Payer: Self-pay | Admitting: Internal Medicine

## 2013-06-20 VITALS — BP 191/92 | HR 58 | Temp 98.1°F | Ht 67.0 in | Wt 217.6 lb

## 2013-06-20 DIAGNOSIS — E785 Hyperlipidemia, unspecified: Secondary | ICD-10-CM

## 2013-06-20 DIAGNOSIS — I10 Essential (primary) hypertension: Secondary | ICD-10-CM

## 2013-06-20 DIAGNOSIS — Z0000 Encounter for general adult medical examination without abnormal findings: Secondary | ICD-10-CM

## 2013-06-20 MED ORDER — METOPROLOL SUCCINATE ER 50 MG PO TB24: 50.0000 mg | ORAL_TABLET | Freq: Every day | ORAL | Status: DC

## 2013-06-20 MED ORDER — AMLODIPINE BESYLATE 5 MG PO TABS: 5.0000 mg | ORAL_TABLET | Freq: Every day | ORAL | Status: DC

## 2013-06-20 NOTE — Progress Notes (Signed)
Pre visit review using our clinic review tool, if applicable. No additional management support is needed unless otherwise documented below in the visit note. 

## 2013-06-20 NOTE — Progress Notes (Signed)
   Subjective:    Patient ID: Albert Walls, male    DOB: Jul 13, 1950, 63 y.o.   MRN: [id number]  HPI He is here for a physical;acute issues denied.    Review of Systems Blood pressure 138/88 with wrist cuff. Compliant with anti hypertemsive medication. No lightheadedness or other adverse medication effect described. Significant headaches, epistaxis, chest pain, palpitations, exertional dyspnea, claudication, paroxysmal nocturnal dyspnea, or edema absent.     Objective:   Physical Exam Gen.: Healthy and well-nourished in appearance. Alert, appropriate and cooperative throughout exam.Appears younger than stated age  Head: Normocephalic without obvious abnormalities; no alopecia  Eyes: No corneal or conjunctival inflammation noted. Pupils equal round reactive to light and accommodation. Extraocular motion intact. Fundal exam reveals arteriolar narrowing without hemorrhages, exudate, papilledema.   Ears: External  ear exam reveals no significant lesions or deformities. Canals clear .TMs normal. Hearing is grossly normal bilaterally. Nose: External nasal exam reveals no deformity or inflammation. Nasal mucosa are pink and moist. No lesions or exudates noted.   Mouth: Oral mucosa and oropharynx reveal no lesions or exudates. Teeth in good repair. Neck: No deformities, masses, or tenderness noted. Range of motion &. Thyroid substernal. Lungs: Normal respiratory effort; chest expands symmetrically. Lungs are clear to auscultation without rales, wheezes, or increased work of breathing. Heart: Normal rate and rhythm. Normal S1 and S2. No gallop, click, or rub. S4 w/o murmur.Repeat BP 190/110 on R ; 1190/110 Abdomen: Bowel sounds normal; abdomen soft and nontender. No masses, organomegaly or hernias noted. Genitalia: Genitalia normal except for left varices. Prostate is normal without enlargement, asymmetry, nodularity, or induration                                    Musculoskeletal/extremities: No  deformity or scoliosis noted of  the thoracic or lumbar spine.  No clubbing, cyanosis, edema, or significant extremity  deformity noted. Range of motion normal .Tone & strength normal. Hand joints normal . Fingernail  health good. Able to lie down & sit up w/o help. Negative SLR bilaterally Vascular: Carotid, radial artery, dorsalis pedis and  posterior tibial pulses are full and equal. No bruits present. Neurologic: Alert and oriented x3. Deep tendon reflexes symmetrical and normal.  Skin: Intact without suspicious lesions or rashes. Lymph: No cervical, axillary, or inguinal lymphadenopathy present. Psych: Mood and affect are normal. Normally interactive                                                                                        Assessment & Plan:  #1 comprehensive physical exam; no acute findings #2 uncontrolled HTN  Plan: see Orders  & Recommendations

## 2013-06-20 NOTE — Patient Instructions (Signed)

## 2013-06-21 ENCOUNTER — Telehealth: Payer: Self-pay | Admitting: Internal Medicine

## 2013-06-21 ENCOUNTER — Encounter: Payer: Self-pay | Admitting: Internal Medicine

## 2013-06-21 LAB — NMR LIPOPROFILE WITH LIPIDS
CHOLESTEROL, TOTAL: 250 mg/dL — AB (ref <200)
HDL Particle Number: 34.5 umol/L (ref >=30.5)
HDL Size: 8.7 nm — ABNORMAL LOW (ref >=9.2)
HDL-C: 55 mg/dL (ref >=40)
LARGE VLDL-P: 12.5 nmol/L — AB (ref <=2.7)
LDL (calc): 155 mg/dL — ABNORMAL HIGH (ref <100)
LDL PARTICLE NUMBER: 1977 nmol/L — AB (ref <1000)
LDL Size: 20.2 nm — ABNORMAL LOW (ref >20.5)
LP-IR Score: 76 — ABNORMAL HIGH (ref <=45)
Large HDL-P: 4.1 umol/L — ABNORMAL LOW (ref >=4.8)
Small LDL Particle Number: 1244 nmol/L — ABNORMAL HIGH (ref <=527)
TRIGLYCERIDES: 198 mg/dL — AB (ref <150)
VLDL SIZE: 52.7 nm — AB (ref <=46.6)

## 2013-06-21 LAB — CBC WITH DIFFERENTIAL/PLATELET
BASOS PCT: 0.7 % (ref 0.0–3.0)
Basophils Absolute: 0.1 10*3/uL (ref 0.0–0.1)
EOS ABS: 0.2 10*3/uL (ref 0.0–0.7)
EOS PCT: 2.3 % (ref 0.0–5.0)
HEMATOCRIT: 43.4 % (ref 39.0–52.0)
Hemoglobin: 14.6 g/dL (ref 13.0–17.0)
LYMPHS ABS: 2.1 10*3/uL (ref 0.7–4.0)
Lymphocytes Relative: 25.5 % (ref 12.0–46.0)
MCHC: 33.6 g/dL (ref 30.0–36.0)
MCV: 87.4 fl (ref 78.0–100.0)
MONO ABS: 0.5 10*3/uL (ref 0.1–1.0)
Monocytes Relative: 5.7 % (ref 3.0–12.0)
Neutro Abs: 5.3 10*3/uL (ref 1.4–7.7)
Neutrophils Relative %: 65.8 % (ref 43.0–77.0)
PLATELETS: 293 10*3/uL (ref 150.0–400.0)
RBC: 4.97 Mil/uL (ref 4.22–5.81)
RDW: 14.3 % (ref 11.5–14.6)
WBC: 8.1 10*3/uL (ref 4.5–10.5)

## 2013-06-21 LAB — HEPATIC FUNCTION PANEL
ALT: 39 U/L (ref 0–53)
AST: 29 U/L (ref 0–37)
Albumin: 4.3 g/dL (ref 3.5–5.2)
Alkaline Phosphatase: 60 U/L (ref 39–117)
Bilirubin, Direct: 0.1 mg/dL (ref 0.0–0.3)
TOTAL PROTEIN: 7.4 g/dL (ref 6.0–8.3)
Total Bilirubin: 1.2 mg/dL (ref 0.3–1.2)

## 2013-06-21 LAB — BASIC METABOLIC PANEL
BUN: 15 mg/dL (ref 6–23)
CHLORIDE: 101 meq/L (ref 96–112)
CO2: 29 meq/L (ref 19–32)
CREATININE: 0.9 mg/dL (ref 0.4–1.5)
Calcium: 9 mg/dL (ref 8.4–10.5)
GFR: 87.17 mL/min (ref >60.00)
Glucose, Bld: 96 mg/dL (ref 70–99)
POTASSIUM: 4 meq/L (ref 3.5–5.1)
Sodium: 137 mEq/L (ref 135–145)

## 2013-06-21 LAB — TSH: TSH: 0.74 u[IU]/mL (ref 0.35–5.50)

## 2013-06-21 NOTE — Telephone Encounter (Signed)
Relevant patient education assigned to patient using Emmi. ° °

## 2013-06-22 ENCOUNTER — Encounter: Payer: Self-pay | Admitting: *Deleted

## 2013-06-23 ENCOUNTER — Ambulatory Visit: Payer: BC Managed Care – PPO | Admitting: *Deleted

## 2013-09-18 ENCOUNTER — Other Ambulatory Visit: Payer: Self-pay | Admitting: Internal Medicine

## 2014-03-01 ENCOUNTER — Other Ambulatory Visit: Payer: Self-pay | Admitting: Internal Medicine

## 2014-04-20 ENCOUNTER — Other Ambulatory Visit: Payer: Self-pay | Admitting: Internal Medicine

## 2014-08-06 ENCOUNTER — Encounter: Payer: Self-pay | Admitting: Internal Medicine

## 2014-08-06 ENCOUNTER — Ambulatory Visit (INDEPENDENT_AMBULATORY_CARE_PROVIDER_SITE_OTHER): Payer: BLUE CROSS/BLUE SHIELD | Admitting: Internal Medicine

## 2014-08-06 ENCOUNTER — Other Ambulatory Visit (INDEPENDENT_AMBULATORY_CARE_PROVIDER_SITE_OTHER): Payer: BLUE CROSS/BLUE SHIELD

## 2014-08-06 VITALS — BP 140/90 | HR 57 | Temp 98.1°F | Resp 14 | Ht 67.0 in | Wt 222.0 lb

## 2014-08-06 DIAGNOSIS — R7989 Other specified abnormal findings of blood chemistry: Secondary | ICD-10-CM

## 2014-08-06 DIAGNOSIS — Z0189 Encounter for other specified special examinations: Secondary | ICD-10-CM

## 2014-08-06 DIAGNOSIS — Z0000 Encounter for general adult medical examination without abnormal findings: Secondary | ICD-10-CM

## 2014-08-06 DIAGNOSIS — K5730 Diverticulosis of large intestine without perforation or abscess without bleeding: Secondary | ICD-10-CM | POA: Insufficient documentation

## 2014-08-06 DIAGNOSIS — E785 Hyperlipidemia, unspecified: Secondary | ICD-10-CM

## 2014-08-06 DIAGNOSIS — I10 Essential (primary) hypertension: Secondary | ICD-10-CM

## 2014-08-06 LAB — CBC WITH DIFFERENTIAL/PLATELET
BASOS PCT: 0.5 % (ref 0.0–3.0)
Basophils Absolute: 0 10*3/uL (ref 0.0–0.1)
EOS ABS: 0.3 10*3/uL (ref 0.0–0.7)
EOS PCT: 3.4 % (ref 0.0–5.0)
HCT: 44.1 % (ref 39.0–52.0)
Hemoglobin: 15.1 g/dL (ref 13.0–17.0)
LYMPHS PCT: 27.2 % (ref 12.0–46.0)
Lymphs Abs: 2.3 10*3/uL (ref 0.7–4.0)
MCHC: 34.3 g/dL (ref 30.0–36.0)
MCV: 86.5 fl (ref 78.0–100.0)
MONO ABS: 0.7 10*3/uL (ref 0.1–1.0)
Monocytes Relative: 8.8 % (ref 3.0–12.0)
NEUTROS PCT: 60.1 % (ref 43.0–77.0)
Neutro Abs: 5.1 10*3/uL (ref 1.4–7.7)
Platelets: 311 10*3/uL (ref 150.0–400.0)
RBC: 5.1 Mil/uL (ref 4.22–5.81)
RDW: 13.3 % (ref 11.5–15.5)
WBC: 8.6 10*3/uL (ref 4.0–10.5)

## 2014-08-06 LAB — BASIC METABOLIC PANEL
BUN: 15 mg/dL (ref 6–23)
CHLORIDE: 101 meq/L (ref 96–112)
CO2: 32 meq/L (ref 19–32)
Calcium: 9.7 mg/dL (ref 8.4–10.5)
Creatinine, Ser: 1.09 mg/dL (ref 0.40–1.50)
GFR: 72.31 mL/min (ref >60.00)
Glucose, Bld: 102 mg/dL — ABNORMAL HIGH (ref 70–99)
POTASSIUM: 4.4 meq/L (ref 3.5–5.1)
SODIUM: 138 meq/L (ref 135–145)

## 2014-08-06 LAB — HEPATIC FUNCTION PANEL
ALT: 28 U/L (ref 0–53)
AST: 23 U/L (ref 0–37)
Albumin: 4.5 g/dL (ref 3.5–5.2)
Alkaline Phosphatase: 59 U/L (ref 39–117)
Bilirubin, Direct: 0.1 mg/dL (ref 0.0–0.3)
Total Bilirubin: 0.7 mg/dL (ref 0.2–1.2)
Total Protein: 7.3 g/dL (ref 6.0–8.3)

## 2014-08-06 LAB — LIPID PANEL
CHOL/HDL RATIO: 5
CHOLESTEROL: 220 mg/dL — AB (ref 0–200)
HDL: 44.3 mg/dL (ref >39.00)
NONHDL: 175.7
TRIGLYCERIDES: 287 mg/dL — AB (ref 0.0–149.0)
VLDL: 57.4 mg/dL — ABNORMAL HIGH (ref 0.0–40.0)

## 2014-08-06 LAB — TSH: TSH: 0.97 u[IU]/mL (ref 0.35–4.50)

## 2014-08-06 LAB — LDL CHOLESTEROL, DIRECT: Direct LDL: 142 mg/dL

## 2014-08-06 MED ORDER — METOPROLOL SUCCINATE ER 50 MG PO TB24: ORAL_TABLET | ORAL | Status: DC

## 2014-08-06 MED ORDER — AMLODIPINE BESYLATE 5 MG PO TABS
5.0000 mg | ORAL_TABLET | Freq: Every day | ORAL | Status: DC
Start: 2014-08-06 — End: 2014-09-26

## 2014-08-06 NOTE — Patient Instructions (Signed)
  Your next office appointment will be determined based upon review of your pending labs  Those instructions will be transmitted to you by mail. Critical values will be called. Followup as needed for any active or acute issue. Please report any significant change in your symptoms.  Minimal Blood Pressure Goal= AVERAGE < 140/90;  Ideal is an AVERAGE < 135/85. This AVERAGE should be calculated from @ least 5-7 BP readings taken @ different times of day on different days of week. You should not respond to isolated BP readings , but rather the AVERAGE for that week .Please bring your  blood pressure cuff to office visits to verify that it is reliable.It  can also be checked against the blood pressure device at the pharmacy. Finger or wrist cuffs are not dependable; an arm cuff is.

## 2014-08-06 NOTE — Progress Notes (Signed)
Pre visit review using our clinic review tool, if applicable. No additional management support is needed unless otherwise documented below in the visit note. 

## 2014-08-06 NOTE — Progress Notes (Signed)
Subjective:    Patient ID: Albert Walls, male    DOB: 1951-01-01, 64 y.o.   MRN: [id number]  HPI He is here for a physical;acute issues include left Achilles tendon tear which has not been treated beyond ACE wrap with improvement.  PMH ,FH, & social history reviewed.   He has been compliant with his medications without adverse effect. He is on a heart healthy low-salt diet. Blood pressure @ home  ranges 1 38-149/82-91. Exercise has decreased due to the weather. He will walk his dog twice a week without associated symptoms.  There is a strong family history of colon cancer. He's had 3 colonoscopies which revealed no polyps. He did have moderate diverticulosis in sigmoid colon in November 2013. He's had some internal hemorrhoidal bleeding intermittently.   Review of Systems    Chest pain, palpitations, tachycardia, exertional dyspnea, paroxysmal nocturnal dyspnea, claudication or edema are absent.  Unexplained weight loss, abdominal pain, significant dyspepsia, dysphagia, melena, persistent rectal bleeding, or persistently small caliber stools are denied.  Dysuria, pyuria, hematuria, frequency, nocturia or polyuria are denied.     Objective:   Physical Exam  Gen.: Adequately nourished in appearance. Alert, appropriate and cooperative throughout exam. BMI: 34.76 Head: Normocephalic without obvious abnormalities;  Minor pattern alopecia  Eyes: No corneal or conjunctival inflammation noted. Pupils equal round reactive to light and accommodation. Extraocular motion intact. Ptosis OD> OS Ears: External  ear exam reveals no significant lesions or deformities. Canals clear .TMs normal. Hearing is grossly normal bilaterally. Nose: External nasal exam reveals no deformity or inflammation. Nasal mucosa are pink and moist. No lesions or exudates noted.   Mouth: Oral mucosa and oropharynx reveal no lesions or exudates. Teeth in good repair. Neck: No deformities, masses, or tenderness noted.  Range of motion &. Thyroid normal. Lungs: Normal respiratory effort; chest expands symmetrically. Lungs are clear to auscultation without rales, wheezes, or increased work of breathing. Heart: Slow rate and regular rhythm. Normal S1 and S2. No gallop, click, or rub. no murmur. Abdomen: Bowel sounds normal; abdomen soft and nontender. No masses, organomegaly or hernias noted. Genitalia: Genitalia normal except for left varices. Prostate is small without enlargement, asymmetry, nodularity, or induration                                    Musculoskeletal/extremities: No deformity or scoliosis noted of  the thoracic or lumbar spine. . No clubbing, cyanosis, edema, or significant extremity  deformity noted.  Range of motion normal . Tone & strength normal. Hand joints normal  Fingernail  health good. Crepitus of  L knee.  Able to lie down & sit up w/o help.  Negative SLR bilaterally Vascular: Carotid, radial artery, dorsalis pedis and  posterior tibial pulses are full and equal. No bruits present. Neurologic: Alert and oriented x3. Deep tendon reflexes symmetrical and normal.  Gait normal       Skin: Intact without suspicious lesions or rashes. Lymph: No cervical, axillary, or inguinal lymphadenopathy present. Psych: Mood and affect are normal. Normally interactive  Assessment & Plan:  #1 comprehensive physical exam; no acute findings  Plan: see Orders  & Recommendations

## 2014-08-08 ENCOUNTER — Telehealth: Payer: Self-pay

## 2014-08-08 ENCOUNTER — Other Ambulatory Visit (INDEPENDENT_AMBULATORY_CARE_PROVIDER_SITE_OTHER): Payer: BLUE CROSS/BLUE SHIELD

## 2014-08-08 DIAGNOSIS — R739 Hyperglycemia, unspecified: Secondary | ICD-10-CM

## 2014-08-08 LAB — HEMOGLOBIN A1C: Hgb A1c MFr Bld: 6.3 % (ref 4.6–6.5)

## 2014-08-08 NOTE — Telephone Encounter (Signed)
Request for add on has been faxed to lab 

## 2014-08-08 NOTE — Telephone Encounter (Signed)
-----   Message from Hendricks Limes, MD sent at 08/07/2014  6:36 PM EST ----- Please add A1c (R73.9)

## 2014-09-26 ENCOUNTER — Other Ambulatory Visit: Payer: Self-pay

## 2014-09-26 DIAGNOSIS — I10 Essential (primary) hypertension: Secondary | ICD-10-CM

## 2014-09-26 MED ORDER — AMLODIPINE BESYLATE 5 MG PO TABS: 5.0000 mg | ORAL_TABLET | Freq: Every day | ORAL | Status: DC

## 2014-09-26 MED ORDER — METOPROLOL SUCCINATE ER 50 MG PO TB24: ORAL_TABLET | ORAL | Status: DC

## 2014-09-29 ENCOUNTER — Other Ambulatory Visit: Payer: Self-pay | Admitting: Internal Medicine

## 2014-10-01 ENCOUNTER — Other Ambulatory Visit: Payer: Self-pay | Admitting: Internal Medicine

## 2015-04-05 ENCOUNTER — Encounter: Payer: Self-pay | Admitting: Gastroenterology

## 2015-07-12 ENCOUNTER — Telehealth: Payer: Self-pay | Admitting: *Deleted

## 2015-07-12 DIAGNOSIS — I10 Essential (primary) hypertension: Secondary | ICD-10-CM

## 2015-07-12 MED ORDER — AMLODIPINE BESYLATE 5 MG PO TABS: 5.0000 mg | ORAL_TABLET | Freq: Every day | ORAL | Status: DC

## 2015-07-12 MED ORDER — METOPROLOL SUCCINATE ER 50 MG PO TB24: ORAL_TABLET | ORAL | Status: DC

## 2015-07-12 NOTE — Telephone Encounter (Signed)
Receive call pt ststes he is needing refills on his BP med. Have appt to see Dr. Sharlet Salina but it's not until 3/13. Inform pt can send 30 day to [place] until appt...Albert Walls

## 2015-08-12 ENCOUNTER — Encounter: Payer: Self-pay | Admitting: Internal Medicine

## 2015-08-12 ENCOUNTER — Other Ambulatory Visit (INDEPENDENT_AMBULATORY_CARE_PROVIDER_SITE_OTHER): Payer: Medicare Other

## 2015-08-12 ENCOUNTER — Ambulatory Visit (INDEPENDENT_AMBULATORY_CARE_PROVIDER_SITE_OTHER): Payer: Medicare Other | Admitting: Internal Medicine

## 2015-08-12 VITALS — BP 170/80 | HR 111 | Temp 98.4°F | Resp 18 | Ht 67.0 in | Wt 221.8 lb

## 2015-08-12 DIAGNOSIS — I10 Essential (primary) hypertension: Secondary | ICD-10-CM

## 2015-08-12 DIAGNOSIS — R7301 Impaired fasting glucose: Secondary | ICD-10-CM | POA: Diagnosis not present

## 2015-08-12 DIAGNOSIS — Z0000 Encounter for general adult medical examination without abnormal findings: Secondary | ICD-10-CM

## 2015-08-12 DIAGNOSIS — E785 Hyperlipidemia, unspecified: Secondary | ICD-10-CM

## 2015-08-12 LAB — LIPID PANEL
CHOL/HDL RATIO: 5
Cholesterol: 231 mg/dL — ABNORMAL HIGH (ref 0–200)
HDL: 46.2 mg/dL (ref >39.00)
NONHDL: 184.72
TRIGLYCERIDES: 309 mg/dL — AB (ref 0.0–149.0)
VLDL: 61.8 mg/dL — AB (ref 0.0–40.0)

## 2015-08-12 LAB — COMPREHENSIVE METABOLIC PANEL
ALT: 28 U/L (ref 0–53)
AST: 24 U/L (ref 0–37)
Albumin: 4.3 g/dL (ref 3.5–5.2)
Alkaline Phosphatase: 53 U/L (ref 39–117)
BUN: 16 mg/dL (ref 6–23)
CHLORIDE: 102 meq/L (ref 96–112)
CO2: 29 meq/L (ref 19–32)
CREATININE: 0.97 mg/dL (ref 0.40–1.50)
Calcium: 9.4 mg/dL (ref 8.4–10.5)
GFR: 82.47 mL/min (ref >60.00)
Glucose, Bld: 121 mg/dL — ABNORMAL HIGH (ref 70–99)
Potassium: 4.2 mEq/L (ref 3.5–5.1)
SODIUM: 140 meq/L (ref 135–145)
Total Bilirubin: 0.8 mg/dL (ref 0.2–1.2)
Total Protein: 7.1 g/dL (ref 6.0–8.3)

## 2015-08-12 LAB — HEPATITIS C ANTIBODY: HCV AB: NEGATIVE

## 2015-08-12 LAB — HEMOGLOBIN A1C: Hgb A1c MFr Bld: 6.1 % (ref 4.6–6.5)

## 2015-08-12 LAB — LDL CHOLESTEROL, DIRECT: LDL DIRECT: 131 mg/dL

## 2015-08-12 MED ORDER — AMLODIPINE BESYLATE 5 MG PO TABS: 5.0000 mg | ORAL_TABLET | Freq: Every day | ORAL | Status: DC

## 2015-08-12 MED ORDER — METOPROLOL SUCCINATE ER 50 MG PO TB24: 50.0000 mg | ORAL_TABLET | Freq: Every day | ORAL | Status: DC

## 2015-08-12 NOTE — Assessment & Plan Note (Signed)
Checking lipid panel and adjust as needed.  

## 2015-08-12 NOTE — Assessment & Plan Note (Signed)
BP elevated today before meds. Declines shingles and pneumonia today. Checking labs and adjust as needed. Counseled on sun safety and regular exercise. Given 10 year screening recommendations.

## 2015-08-12 NOTE — Patient Instructions (Addendum)
Hepatitis A shot needed for the cruise, taken 4 weeks prior to leaving.   If you decide you want the sea sick patch to prevent problems on the cruise just call us and we can get that taken care of.   Keep taking the medicines as before no changes today. We will check the labs and call you with the results.   Health Maintenance, Male A healthy lifestyle and preventative care can promote health and wellness.  Maintain regular health, dental, and eye exams.  Eat a healthy diet. Foods like vegetables, fruits, whole grains, low-fat dairy products, and lean protein foods contain the nutrients you need and are low in calories. Decrease your intake of foods high in solid fats, added sugars, and salt. Get information about a proper diet from your health care provider, if necessary.  Regular physical exercise is one of the most important things you can do for your health. Most adults should get at least 150 minutes of moderate-intensity exercise (any activity that increases your heart rate and causes you to sweat) each week. In addition, most adults need muscle-strengthening exercises on 2 or more days a week.   Maintain a healthy weight. The body mass index (BMI) is a screening tool to identify possible weight problems. It provides an estimate of body fat based on height and weight. Your health care provider can find your BMI and can help you achieve or maintain a healthy weight. For males 20 years and older:  A BMI below 18.5 is considered underweight.  A BMI of 18.5 to 24.9 is normal.  A BMI of 25 to 29.9 is considered overweight.  A BMI of 30 and above is considered obese.  Maintain normal blood lipids and cholesterol by exercising and minimizing your intake of saturated fat. Eat a balanced diet with plenty of fruits and vegetables. Blood tests for lipids and cholesterol should begin at age 72 and be repeated every 5 years. If your lipid or cholesterol levels are high, you are over age 40, or you  are at high risk for heart disease, you may need your cholesterol levels checked more frequently.Ongoing high lipid and cholesterol levels should be treated with medicines if diet and exercise are not working.  If you smoke, find out from your health care provider how to quit. If you do not use tobacco, do not start.  Lung cancer screening is recommended for adults aged 66-80 years who are at high risk for developing lung cancer because of a history of smoking. A yearly low-dose CT scan of the lungs is recommended for people who have at least a 30-pack-year history of smoking and are current smokers or have quit within the past 15 years. A pack year of smoking is smoking an average of 1 pack of cigarettes a day for 1 year (for example, a 30-pack-year history of smoking could mean smoking 1 pack a day for 30 years or 2 packs a day for 15 years). Yearly screening should continue until the smoker has stopped smoking for at least 15 years. Yearly screening should be stopped for people who develop a health problem that would prevent them from having lung cancer treatment.  If you choose to drink alcohol, do not have more than 2 drinks per day. One drink is considered to be 12 oz (360 mL) of beer, 5 oz (150 mL) of wine, or 1.5 oz (45 mL) of liquor.  Avoid the use of street drugs. Do not share needles with anyone. Ask for  help if you need support or instructions about stopping the use of drugs.  High blood pressure causes heart disease and increases the risk of stroke. High blood pressure is more likely to develop in:  People who have blood pressure in the end of the normal range (100-139/85-89 mm Hg).  People who are overweight or obese.  People who are African American.  If you are 58-54 years of age, have your blood pressure checked every 3-5 years. If you are 71 years of age or older, have your blood pressure checked every year. You should have your blood pressure measured twice--once when you are at  a hospital or clinic, and once when you are not at a hospital or clinic. Record the average of the two measurements. To check your blood pressure when you are not at a hospital or clinic, you can use:  An automated blood pressure machine at a pharmacy.  A home blood pressure monitor.  If you are 35-19 years old, ask your health care provider if you should take aspirin to prevent heart disease.  Diabetes screening involves taking a blood sample to check your fasting blood sugar level. This should be done once every 3 years after age 22 if you are at a normal weight and without risk factors for diabetes. Testing should be considered at a younger age or be carried out more frequently if you are overweight and have at least 1 risk factor for diabetes.  Colorectal cancer can be detected and often prevented. Most routine colorectal cancer screening begins at the age of 39 and continues through age 23. However, your health care provider may recommend screening at an earlier age if you have risk factors for colon cancer. On a yearly basis, your health care provider may provide home test kits to check for hidden blood in the stool. A small camera at the end of a tube may be used to directly examine the colon (sigmoidoscopy or colonoscopy) to detect the earliest forms of colorectal cancer. Talk to your health care provider about this at age 33 when routine screening begins. A direct exam of the colon should be repeated every 5-10 years through age 59, unless early forms of precancerous polyps or small growths are found.  People who are at an increased risk for hepatitis B should be screened for this virus. You are considered at high risk for hepatitis B if:  You were born in a country where hepatitis B occurs often. Talk with your health care provider about which countries are considered high risk.  Your parents were born in a high-risk country and you have not received a shot to protect against hepatitis B  (hepatitis B vaccine).  You have HIV or AIDS.  You use needles to inject street drugs.  You live with, or have sex with, someone who has hepatitis B.  You are a man who has sex with other men (MSM).  You get hemodialysis treatment.  You take certain medicines for conditions like cancer, organ transplantation, and autoimmune conditions.  Hepatitis C blood testing is recommended for all people born from [birth year] through 1965 and any individual with known risk factors for hepatitis C.  Healthy men should no longer receive prostate-specific antigen (PSA) blood tests as part of routine cancer screening. Talk to your health care provider about prostate cancer screening.  Testicular cancer screening is not recommended for adolescents or adult males who have no symptoms. Screening includes self-exam, a health care provider exam, and other screening  tests. Consult with your health care provider about any symptoms you have or any concerns you have about testicular cancer.  Practice safe sex. Use condoms and avoid high-risk sexual practices to reduce the spread of sexually transmitted infections (STIs).  You should be screened for STIs, including gonorrhea and chlamydia if:  You are sexually active and are younger than 24 years.  You are older than 24 years, and your health care provider tells you that you are at risk for this type of infection.  Your sexual activity has changed since you were last screened, and you are at an increased risk for chlamydia or gonorrhea. Ask your health care provider if you are at risk.  If you are at risk of being infected with HIV, it is recommended that you take a prescription medicine daily to prevent HIV infection. This is called pre-exposure prophylaxis (PrEP). You are considered at risk if:  You are a man who has sex with other men (MSM).  You are a heterosexual man who is sexually active with multiple partners.  You take drugs by injection.  You are  sexually active with a partner who has HIV.  Talk with your health care provider about whether you are at high risk of being infected with HIV. If you choose to begin PrEP, you should first be tested for HIV. You should then be tested every 3 months for as long as you are taking PrEP.  Use sunscreen. Apply sunscreen liberally and repeatedly throughout the day. You should seek shade when your shadow is shorter than you. Protect yourself by wearing long sleeves, pants, a wide-brimmed hat, and sunglasses year round whenever you are outdoors.  Tell your health care provider of new moles or changes in moles, especially if there is a change in shape or color. Also, tell your health care provider if a mole is larger than the size of a pencil eraser.  A one-time screening for abdominal aortic aneurysm (AAA) and surgical repair of large AAAs by ultrasound is recommended for men aged 98-75 years who are current or former smokers.  Stay current with your vaccines (immunizations).   This information is not intended to replace advice given to you by your health care provider. Make sure you discuss any questions you have with your health care provider.   Document Released: 11/14/2007 Document Revised: 06/08/2014 Document Reviewed: 10/13/2010 Elsevier Interactive Patient Education [employer].

## 2015-08-12 NOTE — Progress Notes (Signed)
Pre visit review using our clinic review tool, if applicable. No additional management support is needed unless otherwise documented below in the visit note. 

## 2015-08-12 NOTE — Progress Notes (Signed)
   Subjective:    Patient ID: Albert Walls, male    DOB: 12/06/1950, 65 y.o.   MRN: [id number]  HPI Here for welcome to medicare wellness, no new complaints. Please see A/P for status and treatment of chronic medical problems.   Diet: heart healthy Physical activity: active Depression/mood screen: negative Hearing: intact to whispered voice Visual acuity: grossly normal, performs annual eye exam  ADLs: capable Fall risk: none Home safety: good Cognitive evaluation: intact to orientation, naming, recall and repetition EOL planning: adv directives discussed  I have personally reviewed and have noted 1. The patient's medical and social history - reviewed today no changes 2. Their use of alcohol, tobacco or illicit drugs 3. Their current medications and supplements 4. The patient's functional ability including ADL's, fall risks, home safety risks and hearing or visual impairment. 5. Diet and physical activities 6. Evidence for depression or mood disorders 7. Care team reviewed and updated (available in snapshot)  Review of Systems  Constitutional: Negative for fever, activity change, appetite change, fatigue and unexpected weight change.  HENT: Negative.   Respiratory: Negative for cough, chest tightness, shortness of breath and wheezing.   Cardiovascular: Negative for chest pain, palpitations and leg swelling.  Gastrointestinal: Negative for nausea, abdominal pain, diarrhea, constipation and abdominal distention.  Musculoskeletal: Negative for myalgias, back pain, arthralgias and neck pain.  Skin: Negative.   Neurological: Negative.  Negative for headaches.  Psychiatric/Behavioral: Negative.       Objective:   Physical Exam  Constitutional: He is oriented to person, place, and time. He appears well-developed and well-nourished.  Overweight  HENT:  Head: Normocephalic and atraumatic.  Eyes: EOM are normal.  Neck: Normal range of motion.  Cardiovascular: Normal rate and  regular rhythm.   Carotids without bruit bilaterally  Pulmonary/Chest: Effort normal and breath sounds normal. No respiratory distress. He has no wheezes. He has no rales.  Abdominal: Soft. Bowel sounds are normal. He exhibits no distension. There is no tenderness. There is no rebound.  Musculoskeletal: He exhibits no edema.  Neurological: He is alert and oriented to person, place, and time. Coordination normal.  Skin: Skin is warm and dry.  Psychiatric: He has a normal mood and affect.   Filed Vitals:   08/12/15 1050  BP: 180/98  Pulse: 111  Temp: 98.4 F (36.9 C)  TempSrc: Oral  Resp: 18  Height: 5\' 7"  (1.702 m)  Weight: 221 lb 12.8 oz (100.608 kg)  SpO2: 98%      Assessment & Plan:

## 2015-08-12 NOTE — Assessment & Plan Note (Signed)
BP elevated today before meds and has been borderline in the past. He would like to monitor at home and increase exercise (has not been since the new year) and watch the pressure. Checking CMP and adjust as needed.

## 2015-09-26 ENCOUNTER — Ambulatory Visit: Payer: Medicare Other

## 2015-10-30 DIAGNOSIS — D225 Melanocytic nevi of trunk: Secondary | ICD-10-CM | POA: Diagnosis not present

## 2015-10-30 DIAGNOSIS — L918 Other hypertrophic disorders of the skin: Secondary | ICD-10-CM | POA: Diagnosis not present

## 2015-10-30 DIAGNOSIS — L821 Other seborrheic keratosis: Secondary | ICD-10-CM | POA: Diagnosis not present

## 2015-10-30 DIAGNOSIS — D2361 Other benign neoplasm of skin of right upper limb, including shoulder: Secondary | ICD-10-CM | POA: Diagnosis not present

## 2015-10-30 DIAGNOSIS — L719 Rosacea, unspecified: Secondary | ICD-10-CM | POA: Diagnosis not present

## 2016-02-26 DIAGNOSIS — H524 Presbyopia: Secondary | ICD-10-CM | POA: Diagnosis not present

## 2016-02-26 DIAGNOSIS — H2513 Age-related nuclear cataract, bilateral: Secondary | ICD-10-CM | POA: Diagnosis not present

## 2016-02-26 DIAGNOSIS — H33003 Unspecified retinal detachment with retinal break, bilateral: Secondary | ICD-10-CM | POA: Diagnosis not present

## 2016-02-26 DIAGNOSIS — H43813 Vitreous degeneration, bilateral: Secondary | ICD-10-CM | POA: Diagnosis not present

## 2016-02-26 DIAGNOSIS — H5203 Hypermetropia, bilateral: Secondary | ICD-10-CM | POA: Diagnosis not present

## 2016-02-26 DIAGNOSIS — H40013 Open angle with borderline findings, low risk, bilateral: Secondary | ICD-10-CM | POA: Diagnosis not present

## 2016-02-26 DIAGNOSIS — H52223 Regular astigmatism, bilateral: Secondary | ICD-10-CM | POA: Diagnosis not present

## 2016-03-02 DIAGNOSIS — H43813 Vitreous degeneration, bilateral: Secondary | ICD-10-CM | POA: Diagnosis not present

## 2016-03-02 DIAGNOSIS — H353132 Nonexudative age-related macular degeneration, bilateral, intermediate dry stage: Secondary | ICD-10-CM | POA: Diagnosis not present

## 2016-03-02 DIAGNOSIS — D3131 Benign neoplasm of right choroid: Secondary | ICD-10-CM | POA: Diagnosis not present

## 2016-03-02 DIAGNOSIS — H33311 Horseshoe tear of retina without detachment, right eye: Secondary | ICD-10-CM | POA: Diagnosis not present

## 2016-04-03 ENCOUNTER — Telehealth: Payer: Self-pay | Admitting: Internal Medicine

## 2016-04-03 NOTE — Telephone Encounter (Signed)
Patient would like to know if he needs the shingles and or pneumonia vac?  Patient is coming in tomorrow for flu vac.

## 2016-04-03 NOTE — Telephone Encounter (Signed)
Spoke with patient. He will call his insurance company to see if the singles vaccine is covered. He will come in tomorrow for a flu shot and schedule a nurse visit for a later date to get his pneumonia.

## 2016-04-04 ENCOUNTER — Ambulatory Visit (INDEPENDENT_AMBULATORY_CARE_PROVIDER_SITE_OTHER): Payer: Medicare Other

## 2016-04-04 DIAGNOSIS — Z23 Encounter for immunization: Secondary | ICD-10-CM

## 2016-08-13 ENCOUNTER — Encounter: Payer: Self-pay | Admitting: Internal Medicine

## 2016-08-13 ENCOUNTER — Other Ambulatory Visit (INDEPENDENT_AMBULATORY_CARE_PROVIDER_SITE_OTHER): Payer: Medicare Other

## 2016-08-13 ENCOUNTER — Ambulatory Visit (INDEPENDENT_AMBULATORY_CARE_PROVIDER_SITE_OTHER): Payer: Medicare Other | Admitting: Internal Medicine

## 2016-08-13 VITALS — BP 140/90 | HR 65 | Temp 98.6°F | Ht 67.0 in | Wt 217.0 lb

## 2016-08-13 DIAGNOSIS — Z23 Encounter for immunization: Secondary | ICD-10-CM

## 2016-08-13 DIAGNOSIS — E785 Hyperlipidemia, unspecified: Secondary | ICD-10-CM

## 2016-08-13 DIAGNOSIS — I10 Essential (primary) hypertension: Secondary | ICD-10-CM | POA: Diagnosis not present

## 2016-08-13 DIAGNOSIS — Z0000 Encounter for general adult medical examination without abnormal findings: Secondary | ICD-10-CM | POA: Diagnosis not present

## 2016-08-13 LAB — CBC
HCT: 42.6 % (ref 39.0–52.0)
Hemoglobin: 14.5 g/dL (ref 13.0–17.0)
MCHC: 34.1 g/dL (ref 30.0–36.0)
MCV: 88.2 fl (ref 78.0–100.0)
Platelets: 320 10*3/uL (ref 150.0–400.0)
RBC: 4.83 Mil/uL (ref 4.22–5.81)
RDW: 13.6 % (ref 11.5–15.5)
WBC: 9.3 10*3/uL (ref 4.0–10.5)

## 2016-08-13 LAB — LDL CHOLESTEROL, DIRECT: LDL DIRECT: 106 mg/dL

## 2016-08-13 LAB — LIPID PANEL
CHOLESTEROL: 204 mg/dL — AB (ref 0–200)
HDL: 38.7 mg/dL — AB (ref >39.00)
NonHDL: 165.26
TRIGLYCERIDES: 277 mg/dL — AB (ref 0.0–149.0)
Total CHOL/HDL Ratio: 5
VLDL: 55.4 mg/dL — ABNORMAL HIGH (ref 0.0–40.0)

## 2016-08-13 LAB — COMPREHENSIVE METABOLIC PANEL
ALK PHOS: 56 U/L (ref 39–117)
ALT: 22 U/L (ref 0–53)
AST: 19 U/L (ref 0–37)
Albumin: 4.2 g/dL (ref 3.5–5.2)
BILIRUBIN TOTAL: 0.6 mg/dL (ref 0.2–1.2)
BUN: 20 mg/dL (ref 6–23)
CALCIUM: 9.8 mg/dL (ref 8.4–10.5)
CO2: 31 mEq/L (ref 19–32)
Chloride: 102 mEq/L (ref 96–112)
Creatinine, Ser: 1.02 mg/dL (ref 0.40–1.50)
GFR: 77.58 mL/min (ref >60.00)
Glucose, Bld: 107 mg/dL — ABNORMAL HIGH (ref 70–99)
Potassium: 4.3 mEq/L (ref 3.5–5.1)
Sodium: 140 mEq/L (ref 135–145)
TOTAL PROTEIN: 7 g/dL (ref 6.0–8.3)

## 2016-08-13 NOTE — Assessment & Plan Note (Signed)
Given prevnar, discussed shingles shot at visit. Flu and tetanus up to date. Colonoscopy up to date. Counseled about sun safety and mole surveillance as well as dangers of distracted driving. Given 10 year screening recommendations.

## 2016-08-13 NOTE — Progress Notes (Signed)
   Subjective:    Patient ID: Albert Walls, male    DOB: February 19, 1951, 66 y.o.   MRN: [id number]  HPI Here for medicare wellness, no new complaints. Please see A/P for status and treatment of chronic medical problems.   HPI #2: Here for follow up of chronic problems including high cholesterol (needs lipid panel, not exercising lately, not on meds, diet has been poor this winter, taking daily aspirin, no hx CAD) and his blood pressure (taking amlodipine and metoprolol, no side effects from meds, no chest pains or SOB or abdominal pain), and his GERD (controlled without meds, sometimes takes otc for the symptoms, tries not to eat late at night).   Diet: heart healthy Physical activity: sedentary Depression/mood screen: negative Hearing: intact to whispered voice Visual acuity: grossly normal, performs annual eye exam  ADLs: capable Fall risk: none Home safety: good Cognitive evaluation: intact to orientation, naming, recall and repetition EOL planning: adv directives discussed  I have personally reviewed and have noted 1. The patient's medical and social history - reviewed today no changes 2. Their use of alcohol, tobacco or illicit drugs 3. Their current medications and supplements 4. The patient's functional ability including ADL's, fall risks, home safety risks and hearing or visual impairment. 5. Diet and physical activities 6. Evidence for depression or mood disorders 7. Care team reviewed and updated (available in snapshot)  Review of Systems  Constitutional: Negative.   HENT: Negative.   Eyes: Negative.   Respiratory: Negative for cough, chest tightness and shortness of breath.   Cardiovascular: Negative for chest pain, palpitations and leg swelling.  Gastrointestinal: Negative for abdominal distention, abdominal pain, constipation, diarrhea, nausea and vomiting.  Musculoskeletal: Negative.   Skin: Negative.   Neurological: Negative.   Psychiatric/Behavioral: Negative.         Objective:   Physical Exam  Constitutional: He is oriented to person, place, and time. He appears well-developed and well-nourished.  HENT:  Head: Normocephalic and atraumatic.  Eyes: EOM are normal.  Neck: Normal range of motion.  Cardiovascular: Normal rate and regular rhythm.   Pulmonary/Chest: Effort normal and breath sounds normal. No respiratory distress. He has no wheezes. He has no rales.  Abdominal: Soft. Bowel sounds are normal. He exhibits no distension. There is no tenderness. There is no rebound.  Musculoskeletal: He exhibits no edema.  Neurological: He is alert and oriented to person, place, and time. Coordination normal.  Skin: Skin is warm and dry.  Psychiatric: He has a normal mood and affect.   Vitals:   08/13/16 0759  BP: 140/90  Pulse: 65  Temp: 98.6 F (37 C)  TempSrc: Oral  SpO2: 98%  Weight: 217 lb (98.4 kg)  Height: 5\' 7"  (1.702 m)      Assessment & Plan:  Prevnar 13 given at visit.

## 2016-08-13 NOTE — Progress Notes (Signed)
Pre visit review using our clinic review tool, if applicable. No additional management support is needed unless otherwise documented below in the visit note. 

## 2016-08-13 NOTE — Assessment & Plan Note (Signed)
Checking lipid panel and adjust for LDL goal <130. Not on meds at this time.

## 2016-08-13 NOTE — Assessment & Plan Note (Signed)
BP close to goal with amlodipine and metoprolol. Will monitor at home and if still elevated will call back and increase amlodipine to 10 mg daily. Checking CMP and adjust as needed.

## 2016-08-13 NOTE — Patient Instructions (Signed)
We are checking the labs and will get back with you on the results.    Health Maintenance, Male A healthy lifestyle and preventive care is important for your health and wellness. Ask your health care provider about what schedule of regular examinations is right for you. What should I know about weight and diet?  Eat a Healthy Diet  Eat plenty of vegetables, fruits, whole grains, low-fat dairy products, and lean protein.  Do not eat a lot of foods high in solid fats, added sugars, or salt. Maintain a Healthy Weight  Regular exercise can help you achieve or maintain a healthy weight. You should:  Do at least 150 minutes of exercise each week. The exercise should increase your heart rate and make you sweat (moderate-intensity exercise).  Do strength-training exercises at least twice a week. Watch Your Levels of Cholesterol and Blood Lipids  Have your blood tested for lipids and cholesterol every 5 years starting at 67 years of age. If you are at high risk for heart disease, you should start having your blood tested when you are 67 years old. You may need to have your cholesterol levels checked more often if:  Your lipid or cholesterol levels are high.  You are older than 67 years of age.  You are at high risk for heart disease. What should I know about cancer screening? Many types of cancers can be detected early and may often be prevented. Lung Cancer  You should be screened every year for lung cancer if:  You are a current smoker who has smoked for at least 30 years.  You are a former smoker who has quit within the past 15 years.  Talk to your health care provider about your screening options, when you should start screening, and how often you should be screened. Colorectal Cancer  Routine colorectal cancer screening usually begins at 67 years of age and should be repeated every 5-10 years until you are 66 years old. You may need to be screened more often if early forms of  precancerous polyps or small growths are found. Your health care provider may recommend screening at an earlier age if you have risk factors for colon cancer.  Your health care provider may recommend using home test kits to check for hidden blood in the stool.  A small camera at the end of a tube can be used to examine your colon (sigmoidoscopy or colonoscopy). This checks for the earliest forms of colorectal cancer. Prostate and Testicular Cancer  Depending on your age and overall health, your health care provider may do certain tests to screen for prostate and testicular cancer.  Talk to your health care provider about any symptoms or concerns you have about testicular or prostate cancer. Skin Cancer  Check your skin from head to toe regularly.  Tell your health care provider about any new moles or changes in moles, especially if:  There is a change in a mole's size, shape, or color.  You have a mole that is larger than a pencil eraser.  Always use sunscreen. Apply sunscreen liberally and repeat throughout the day.  Protect yourself by wearing long sleeves, pants, a wide-brimmed hat, and sunglasses when outside. What should I know about heart disease, diabetes, and high blood pressure?  If you are 48-23 years of age, have your blood pressure checked every 3-5 years. If you are 58 years of age or older, have your blood pressure checked every year. You should have your blood pressure measured  twice-once when you are at a hospital or clinic, and once when you are not at a hospital or clinic. Record the average of the two measurements. To check your blood pressure when you are not at a hospital or clinic, you can use:  An automated blood pressure machine at a pharmacy.  A home blood pressure monitor.  Talk to your health care provider about your target blood pressure.  If you are between 65-39 years old, ask your health care provider if you should take aspirin to prevent heart  disease.  Have regular diabetes screenings by checking your fasting blood sugar level.  If you are at a normal weight and have a low risk for diabetes, have this test once every three years after the age of 57.  If you are overweight and have a high risk for diabetes, consider being tested at a younger age or more often.  A one-time screening for abdominal aortic aneurysm (AAA) by ultrasound is recommended for men aged 76-75 years who are current or former smokers. What should I know about preventing infection? Hepatitis B  If you have a higher risk for hepatitis B, you should be screened for this virus. Talk with your health care provider to find out if you are at risk for hepatitis B infection. Hepatitis C  Blood testing is recommended for:  Everyone born from [birth year] through 1966.  Anyone with known risk factors for hepatitis C. Sexually Transmitted Diseases (STDs)  You should be screened each year for STDs including gonorrhea and chlamydia if:  You are sexually active and are younger than 67 years of age.  You are older than 67 years of age and your health care provider tells you that you are at risk for this type of infection.  Your sexual activity has changed since you were last screened and you are at an increased risk for chlamydia or gonorrhea. Ask your health care provider if you are at risk.  Talk with your health care provider about whether you are at high risk of being infected with HIV. Your health care provider may recommend a prescription medicine to help prevent HIV infection. What else can I do?  Schedule regular health, dental, and eye exams.  Stay current with your vaccines (immunizations).  Do not use any tobacco products, such as cigarettes, chewing tobacco, and e-cigarettes. If you need help quitting, ask your health care provider.  Limit alcohol intake to no more than 2 drinks per day. One drink equals 12 ounces of beer, 5 ounces of wine, or 1 ounces of hard  liquor.  Do not use street drugs.  Do not share needles.  Ask your health care provider for help if you need support or information about quitting drugs.  Tell your health care provider if you often feel depressed.  Tell your health care provider if you have ever been abused or do not feel safe at home. This information is not intended to replace advice given to you by your health care provider. Make sure you discuss any questions you have with your health care provider. Document Released: 11/14/2007 Document Revised: 01/15/2016 Document Reviewed: 02/19/2015 Elsevier Interactive Patient Education  2017 [employer].

## 2016-08-21 ENCOUNTER — Other Ambulatory Visit: Payer: Self-pay | Admitting: Internal Medicine

## 2016-08-21 DIAGNOSIS — I10 Essential (primary) hypertension: Secondary | ICD-10-CM

## 2016-12-17 DIAGNOSIS — L821 Other seborrheic keratosis: Secondary | ICD-10-CM | POA: Diagnosis not present

## 2016-12-17 DIAGNOSIS — D1801 Hemangioma of skin and subcutaneous tissue: Secondary | ICD-10-CM | POA: Diagnosis not present

## 2016-12-17 DIAGNOSIS — L719 Rosacea, unspecified: Secondary | ICD-10-CM | POA: Diagnosis not present

## 2016-12-17 DIAGNOSIS — L570 Actinic keratosis: Secondary | ICD-10-CM | POA: Diagnosis not present

## 2016-12-21 ENCOUNTER — Other Ambulatory Visit: Payer: Self-pay

## 2017-01-20 ENCOUNTER — Telehealth: Payer: Self-pay | Admitting: Internal Medicine

## 2017-01-20 NOTE — Telephone Encounter (Signed)
Patient is requesting a Pneumonia injection and a Shingrix injections. He states that's Dr. Sharlet Salina spoke with him about it in May but said he needed to wait a while. He wants to know if Oct would be a good time to do this?  Patient is aware Dr.Crawford is out of office until 09.05.18

## 2017-02-03 NOTE — Telephone Encounter (Signed)
Routing to dr crawford, are you ok with patient's request, I will call him back, thanks

## 2017-02-03 NOTE — Telephone Encounter (Signed)
Patient has scheduled 10/2 office visit, understands we may not have extra shingrix at that time

## 2017-02-03 NOTE — Telephone Encounter (Signed)
This is fine 

## 2017-03-02 ENCOUNTER — Other Ambulatory Visit (INDEPENDENT_AMBULATORY_CARE_PROVIDER_SITE_OTHER): Payer: Medicare Other

## 2017-03-02 ENCOUNTER — Ambulatory Visit (INDEPENDENT_AMBULATORY_CARE_PROVIDER_SITE_OTHER): Payer: Medicare Other | Admitting: Internal Medicine

## 2017-03-02 ENCOUNTER — Other Ambulatory Visit: Payer: Self-pay | Admitting: Internal Medicine

## 2017-03-02 ENCOUNTER — Encounter: Payer: Self-pay | Admitting: Internal Medicine

## 2017-03-02 VITALS — BP 142/90 | HR 62 | Temp 98.5°F | Ht 67.0 in | Wt 207.0 lb

## 2017-03-02 DIAGNOSIS — I10 Essential (primary) hypertension: Secondary | ICD-10-CM | POA: Diagnosis not present

## 2017-03-02 DIAGNOSIS — R972 Elevated prostate specific antigen [PSA]: Secondary | ICD-10-CM | POA: Insufficient documentation

## 2017-03-02 DIAGNOSIS — Z23 Encounter for immunization: Secondary | ICD-10-CM

## 2017-03-02 DIAGNOSIS — Z8042 Family history of malignant neoplasm of prostate: Secondary | ICD-10-CM

## 2017-03-02 DIAGNOSIS — N400 Enlarged prostate without lower urinary tract symptoms: Secondary | ICD-10-CM | POA: Diagnosis not present

## 2017-03-02 DIAGNOSIS — C61 Malignant neoplasm of prostate: Secondary | ICD-10-CM | POA: Insufficient documentation

## 2017-03-02 DIAGNOSIS — Z8546 Personal history of malignant neoplasm of prostate: Secondary | ICD-10-CM | POA: Insufficient documentation

## 2017-03-02 LAB — PSA: PSA: 2.62 ng/mL (ref 0.10–4.00)

## 2017-03-02 MED ORDER — ZOSTER VAC RECOMB ADJUVANTED 50 MCG/0.5ML IM SUSR: 0.5000 mL | Freq: Once | INTRAMUSCULAR | 1 refills | Status: AC

## 2017-03-02 MED ORDER — AMLODIPINE BESYLATE 5 MG PO TABS: 5.0000 mg | ORAL_TABLET | Freq: Every day | ORAL | 3 refills | Status: DC

## 2017-03-02 MED ORDER — METOPROLOL SUCCINATE ER 50 MG PO TB24
50.0000 mg | ORAL_TABLET | Freq: Every day | ORAL | 3 refills | Status: DC
Start: 2017-03-02 — End: 2017-03-10

## 2017-03-02 NOTE — Assessment & Plan Note (Signed)
BP at goal and refills done of the metoprolol.

## 2017-03-02 NOTE — Progress Notes (Signed)
   Subjective:    Patient ID: Albert Walls, male    DOB: September 15, 1950, 66 y.o.   MRN: [id number]  HPI The patient is a 66 YO man coming in for concerns about his PSA. It was not checked at last visit. He does have family history of prostate cancer. He denies worsening urinary symptoms. Has some pain in his left hip and is worried about that. Denies getting up more than once at night to urinate.   Review of Systems  Constitutional: Negative.   Respiratory: Negative for cough, chest tightness and shortness of breath.   Cardiovascular: Negative for chest pain, palpitations and leg swelling.  Gastrointestinal: Negative for abdominal distention, abdominal pain, constipation, diarrhea, nausea and vomiting.  Genitourinary: Negative.   Musculoskeletal: Negative.   Skin: Negative.   Neurological: Negative.       Objective:   Physical Exam  Constitutional: He is oriented to person, place, and time. He appears well-developed and well-nourished.  HENT:  Head: Normocephalic and atraumatic.  Eyes: EOM are normal.  Neck: Normal range of motion.  Cardiovascular: Normal rate and regular rhythm.   Pulmonary/Chest: Effort normal and breath sounds normal. No respiratory distress. He has no wheezes. He has no rales.  Abdominal: Soft. Bowel sounds are normal. He exhibits no distension. There is no tenderness. There is no rebound.  Musculoskeletal: He exhibits no edema.  Neurological: He is alert and oriented to person, place, and time. Coordination normal.  Skin: Skin is warm and dry.   Vitals:   03/02/17 1028  BP: (!) 142/90  Pulse: 62  Temp: 98.5 F (36.9 C)  TempSrc: Oral  SpO2: 98%  Weight: 207 lb (93.9 kg)  Height: 5\' 7"  (1.702 m)      Assessment & Plan:  Flu shot given at visit

## 2017-03-02 NOTE — Assessment & Plan Note (Signed)
Checking PSA and was counseled appropriately on the risks of screening.

## 2017-03-02 NOTE — Patient Instructions (Signed)
We will check the labs today and call you back with the results.   We have given you the prescription for the shingles shot to get.

## 2017-03-04 ENCOUNTER — Other Ambulatory Visit: Payer: Self-pay | Admitting: Internal Medicine

## 2017-03-04 DIAGNOSIS — I10 Essential (primary) hypertension: Secondary | ICD-10-CM

## 2017-03-06 ENCOUNTER — Telehealth: Payer: Self-pay | Admitting: Internal Medicine

## 2017-03-06 DIAGNOSIS — I10 Essential (primary) hypertension: Secondary | ICD-10-CM

## 2017-03-10 ENCOUNTER — Other Ambulatory Visit: Payer: Self-pay

## 2017-03-10 DIAGNOSIS — I10 Essential (primary) hypertension: Secondary | ICD-10-CM

## 2017-03-10 MED ORDER — AMLODIPINE BESYLATE 5 MG PO TABS: 5.0000 mg | ORAL_TABLET | Freq: Every day | ORAL | 3 refills | Status: DC

## 2017-03-10 MED ORDER — METOPROLOL SUCCINATE ER 50 MG PO TB24: 50.0000 mg | ORAL_TABLET | Freq: Every day | ORAL | 3 refills | Status: DC

## 2017-03-10 NOTE — Telephone Encounter (Signed)
Medications sent to [facility]

## 2017-03-10 NOTE — Telephone Encounter (Signed)
Pt called stating that these prescriptions should have been sent to [facility], not [facility]. He is now completely out.

## 2017-06-16 DIAGNOSIS — H40013 Open angle with borderline findings, low risk, bilateral: Secondary | ICD-10-CM | POA: Diagnosis not present

## 2017-09-13 ENCOUNTER — Other Ambulatory Visit (INDEPENDENT_AMBULATORY_CARE_PROVIDER_SITE_OTHER): Payer: Medicare Other

## 2017-09-13 ENCOUNTER — Ambulatory Visit (INDEPENDENT_AMBULATORY_CARE_PROVIDER_SITE_OTHER): Payer: Medicare Other | Admitting: Internal Medicine

## 2017-09-13 ENCOUNTER — Encounter: Payer: Self-pay | Admitting: Internal Medicine

## 2017-09-13 VITALS — BP 124/80 | HR 61 | Temp 97.9°F | Ht 67.0 in | Wt 204.0 lb

## 2017-09-13 DIAGNOSIS — Z800 Family history of malignant neoplasm of digestive organs: Secondary | ICD-10-CM

## 2017-09-13 DIAGNOSIS — Z8042 Family history of malignant neoplasm of prostate: Secondary | ICD-10-CM | POA: Diagnosis not present

## 2017-09-13 DIAGNOSIS — N400 Enlarged prostate without lower urinary tract symptoms: Secondary | ICD-10-CM | POA: Diagnosis not present

## 2017-09-13 DIAGNOSIS — I10 Essential (primary) hypertension: Secondary | ICD-10-CM

## 2017-09-13 DIAGNOSIS — Z23 Encounter for immunization: Secondary | ICD-10-CM | POA: Diagnosis not present

## 2017-09-13 DIAGNOSIS — Z0000 Encounter for general adult medical examination without abnormal findings: Secondary | ICD-10-CM | POA: Diagnosis not present

## 2017-09-13 LAB — COMPREHENSIVE METABOLIC PANEL WITH GFR
ALT: 18 U/L (ref 0–53)
AST: 17 U/L (ref 0–37)
Albumin: 4.4 g/dL (ref 3.5–5.2)
Alkaline Phosphatase: 59 U/L (ref 39–117)
BUN: 19 mg/dL (ref 6–23)
CO2: 29 meq/L (ref 19–32)
Calcium: 9.8 mg/dL (ref 8.4–10.5)
Chloride: 100 meq/L (ref 96–112)
Creatinine, Ser: 1.01 mg/dL (ref 0.40–1.50)
GFR: 78.21 mL/min
Glucose, Bld: 106 mg/dL — ABNORMAL HIGH (ref 70–99)
Potassium: 4.2 meq/L (ref 3.5–5.1)
Sodium: 139 meq/L (ref 135–145)
Total Bilirubin: 0.7 mg/dL (ref 0.2–1.2)
Total Protein: 6.9 g/dL (ref 6.0–8.3)

## 2017-09-13 LAB — CBC
HCT: 44.6 % (ref 39.0–52.0)
Hemoglobin: 15.4 g/dL (ref 13.0–17.0)
MCHC: 34.5 g/dL (ref 30.0–36.0)
MCV: 88 fl (ref 78.0–100.0)
Platelets: 294 10*3/uL (ref 150.0–400.0)
RBC: 5.06 Mil/uL (ref 4.22–5.81)
RDW: 13.7 % (ref 11.5–15.5)
WBC: 7.3 10*3/uL (ref 4.0–10.5)

## 2017-09-13 LAB — LIPID PANEL
CHOL/HDL RATIO: 5
Cholesterol: 249 mg/dL — ABNORMAL HIGH (ref 0–200)
HDL: 50.3 mg/dL (ref >39.00)
LDL Cholesterol: 163 mg/dL — ABNORMAL HIGH (ref 0–99)
NONHDL: 198.28
Triglycerides: 174 mg/dL — ABNORMAL HIGH (ref 0.0–149.0)
VLDL: 34.8 mg/dL (ref 0.0–40.0)

## 2017-09-13 LAB — PSA: PSA: 3.55 ng/mL (ref 0.10–4.00)

## 2017-09-13 MED ORDER — ZOSTER VAC RECOMB ADJUVANTED 50 MCG/0.5ML IM SUSR: 0.5000 mL | Freq: Once | INTRAMUSCULAR | 1 refills | Status: AC

## 2017-09-13 NOTE — Patient Instructions (Addendum)
It is okay to stop taking aspirin.   We have given you the shingles vaccine prescription to take to a pharmacy.   We will get you in for the colonoscopy.    Health Maintenance, Male A healthy lifestyle and preventive care is important for your health and wellness. Ask your health care provider about what schedule of regular examinations is right for you. What should I know about weight and diet? Eat a Healthy Diet  Eat plenty of vegetables, fruits, whole grains, low-fat dairy products, and lean protein.  Do not eat a lot of foods high in solid fats, added sugars, or salt.  Maintain a Healthy Weight Regular exercise can help you achieve or maintain a healthy weight. You should:  Do at least 150 minutes of exercise each week. The exercise should increase your heart rate and make you sweat (moderate-intensity exercise).  Do strength-training exercises at least twice a week.  Watch Your Levels of Cholesterol and Blood Lipids  Have your blood tested for lipids and cholesterol every 5 years starting at 68 years of age. If you are at high risk for heart disease, you should start having your blood tested when you are 68 years old. You may need to have your cholesterol levels checked more often if: ? Your lipid or cholesterol levels are high. ? You are older than 68 years of age. ? You are at high risk for heart disease.  What should I know about cancer screening? Many types of cancers can be detected early and may often be prevented. Lung Cancer  You should be screened every year for lung cancer if: ? You are a current smoker who has smoked for at least 30 years. ? You are a former smoker who has quit within the past 15 years.  Talk to your health care provider about your screening options, when you should start screening, and how often you should be screened.  Colorectal Cancer  Routine colorectal cancer screening usually begins at 68 years of age and should be repeated every 5-10  years until you are 68 years old. You may need to be screened more often if early forms of precancerous polyps or small growths are found. Your health care provider may recommend screening at an earlier age if you have risk factors for colon cancer.  Your health care provider may recommend using home test kits to check for hidden blood in the stool.  A small camera at the end of a tube can be used to examine your colon (sigmoidoscopy or colonoscopy). This checks for the earliest forms of colorectal cancer.  Prostate and Testicular Cancer  Depending on your age and overall health, your health care provider may do certain tests to screen for prostate and testicular cancer.  Talk to your health care provider about any symptoms or concerns you have about testicular or prostate cancer.  Skin Cancer  Check your skin from head to toe regularly.  Tell your health care provider about any new moles or changes in moles, especially if: ? There is a change in a mole's size, shape, or color. ? You have a mole that is larger than a pencil eraser.  Always use sunscreen. Apply sunscreen liberally and repeat throughout the day.  Protect yourself by wearing long sleeves, pants, a wide-brimmed hat, and sunglasses when outside.  What should I know about heart disease, diabetes, and high blood pressure?  If you are 6-2 years of age, have your blood pressure checked every 3-5 years.  If you are 71 years of age or older, have your blood pressure checked every year. You should have your blood pressure measured twice-once when you are at a hospital or clinic, and once when you are not at a hospital or clinic. Record the average of the two measurements. To check your blood pressure when you are not at a hospital or clinic, you can use: ? An automated blood pressure machine at a pharmacy. ? A home blood pressure monitor.  Talk to your health care provider about your target blood pressure.  If you are between  37-46 years old, ask your health care provider if you should take aspirin to prevent heart disease.  Have regular diabetes screenings by checking your fasting blood sugar level. ? If you are at a normal weight and have a low risk for diabetes, have this test once every three years after the age of 20. ? If you are overweight and have a high risk for diabetes, consider being tested at a younger age or more often.  A one-time screening for abdominal aortic aneurysm (AAA) by ultrasound is recommended for men aged 28-75 years who are current or former smokers. What should I know about preventing infection? Hepatitis B If you have a higher risk for hepatitis B, you should be screened for this virus. Talk with your health care provider to find out if you are at risk for hepatitis B infection. Hepatitis C Blood testing is recommended for:  Everyone born from [birth year] through 1966.  Anyone with known risk factors for hepatitis C.  Sexually Transmitted Diseases (STDs)  You should be screened each year for STDs including gonorrhea and chlamydia if: ? You are sexually active and are younger than 68 years of age. ? You are older than 68 years of age and your health care provider tells you that you are at risk for this type of infection. ? Your sexual activity has changed since you were last screened and you are at an increased risk for chlamydia or gonorrhea. Ask your health care provider if you are at risk.  Talk with your health care provider about whether you are at high risk of being infected with HIV. Your health care provider may recommend a prescription medicine to help prevent HIV infection.  What else can I do?  Schedule regular health, dental, and eye exams.  Stay current with your vaccines (immunizations).  Do not use any tobacco products, such as cigarettes, chewing tobacco, and e-cigarettes. If you need help quitting, ask your health care provider.  Limit alcohol intake to no more than  2 drinks per day. One drink equals 12 ounces of beer, 5 ounces of wine, or 1 ounces of hard liquor.  Do not use street drugs.  Do not share needles.  Ask your health care provider for help if you need support or information about quitting drugs.  Tell your health care provider if you often feel depressed.  Tell your health care provider if you have ever been abused or do not feel safe at home. This information is not intended to replace advice given to you by your health care provider. Make sure you discuss any questions you have with your health care provider. Document Released: 11/14/2007 Document Revised: 01/15/2016 Document Reviewed: 02/19/2015 Elsevier Interactive Patient Education  [employer].

## 2017-09-13 NOTE — Assessment & Plan Note (Signed)
Checking PSA and last okay. No new symptoms.

## 2017-09-13 NOTE — Assessment & Plan Note (Signed)
BP at goal on amlodipine 5 mg daily and metoprolol 50 mg daily. Checking CMP and adjust as needed.  ?

## 2017-09-13 NOTE — Assessment & Plan Note (Signed)
Now with 3-4 family members needs colonoscopy every 5 years. Last 2013 without polyps and will refer back to GI for colonoscopy.

## 2017-09-13 NOTE — Assessment & Plan Note (Signed)
Flu shot reminded. Pneumonia 23 given at visit to complete series. Shingrix rx given. Colonoscopy referral done today.  Counseled about sun safety and mole surveillance. Counseled about the dangers of distracted driving. Given 10 year screening recommendations.

## 2017-09-13 NOTE — Progress Notes (Signed)
   Subjective:    Patient ID: Albert Walls, male    DOB: May 15, 1951, 67 y.o.   MRN: [id number]  HPI Here for medicare wellness, no new complaints. Please see A/P for status and treatment of chronic medical problems.   HPI #2: here for follow up blood pressure (losing weight intentionally, BP running lower at home, taking metoprolol and amlodipine, denies headaches or chest pains), family history colon cancer (through ancestry research has found several more family members with colon cancer listed on death certificate, now totaling 4-5 people, last colonoscopy 2013) and family history prostate cancer (wants PSA screening today, aware of risks and benefits).   Diet: heart healthy Physical activity: active Depression/mood screen: negative Hearing: intact to whispered voice Visual acuity: grossly normal, performs annual eye exam  ADLs: capable Fall risk: none Home safety: good Cognitive evaluation: intact to orientation, naming, recall and repetition EOL planning: adv directives discussed  I have personally reviewed and have noted 1. The patient's medical and social history - reviewed today no changes 2. Their use of alcohol, tobacco or illicit drugs 3. Their current medications and supplements 4. The patient's functional ability including ADL's, fall risks, home safety risks and hearing or visual impairment. 5. Diet and physical activities 6. Evidence for depression or mood disorders 7. Care team reviewed and updated (available in snapshot)  Review of Systems  Constitutional: Negative.   HENT: Negative.   Eyes: Negative.   Respiratory: Negative for cough, chest tightness and shortness of breath.   Cardiovascular: Negative for chest pain, palpitations and leg swelling.  Gastrointestinal: Negative for abdominal distention, abdominal pain, constipation, diarrhea, nausea and vomiting.  Musculoskeletal: Negative.   Skin: Negative.   Neurological: Negative.   Psychiatric/Behavioral:  Negative.       Objective:   Physical Exam  Constitutional: He is oriented to person, place, and time. He appears well-developed and well-nourished.  HENT:  Head: Normocephalic and atraumatic.  Eyes: EOM are normal.  Neck: Normal range of motion.  Cardiovascular: Normal rate and regular rhythm.  Pulmonary/Chest: Effort normal and breath sounds normal. No respiratory distress. He has no wheezes. He has no rales.  Abdominal: Soft. Bowel sounds are normal. He exhibits no distension. There is no tenderness. There is no rebound.  Musculoskeletal: He exhibits no edema.  Neurological: He is alert and oriented to person, place, and time. Coordination normal.  Skin: Skin is warm and dry.  Psychiatric: He has a normal mood and affect.   Vitals:   09/13/17 0857  BP: 124/80  Pulse: 61  Temp: 97.9 F (36.6 C)  TempSrc: Oral  SpO2: 97%  Weight: 204 lb (92.5 kg)  Height: 5\' 7"  (1.702 m)      Assessment & Plan:  Pneumonia 23 given at visit

## 2017-09-20 ENCOUNTER — Encounter: Payer: Self-pay | Admitting: Gastroenterology

## 2017-11-11 ENCOUNTER — Ambulatory Visit: Payer: Medicare Other | Admitting: Gastroenterology

## 2017-11-17 ENCOUNTER — Ambulatory Visit (INDEPENDENT_AMBULATORY_CARE_PROVIDER_SITE_OTHER): Payer: Medicare Other | Admitting: Gastroenterology

## 2017-11-17 ENCOUNTER — Encounter: Payer: Self-pay | Admitting: Gastroenterology

## 2017-11-17 VITALS — BP 130/80 | HR 76 | Ht 67.0 in | Wt 202.0 lb

## 2017-11-17 DIAGNOSIS — R194 Change in bowel habit: Secondary | ICD-10-CM

## 2017-11-17 NOTE — Progress Notes (Signed)
[facility] Consult Note:  History: Albert Walls 11/17/2017  Referring physician: Hoyt Koch, MD  Reason for consult/chief complaint: Colon Cancer Screening (Family hx colon cancer) and Constipation (alternating with diarrhea)   Subjective  HPI:  This is a very pleasant 67 year old man referred by primary care for altered bowel habits and family history of colon cancer.  He was seen at an office visit in 2013 for rectal bleeding, and underwent colonoscopy by Dr. Deatra Ina November 2013.  This was a normal study with suspected hemorrhoidal bleeding, recommendation was for repeat exam in 10 years.  Diar has not had bleeding since then.  For about the last year he has had a diet change in attempt to lose weight.  With that, he has had intermittent constipation or diarrhea, but also sometimes periods of weeks where he will have normal bowel movements.  He has not noticed any particular food triggers.  He denies abdominal pain, nausea, vomiting, dysphagia, early satiety.  He has been able to purposely lose weight through his diet and exercise plan.  With that, his previous heartburn symptoms significantly improved.   ROS:  Review of Systems  Constitutional: Negative for appetite change and unexpected weight change.  HENT: Negative for mouth sores and voice change.   Eyes: Negative for pain and redness.  Respiratory: Negative for cough and shortness of breath.   Cardiovascular: Negative for chest pain and palpitations.  Genitourinary: Negative for dysuria and hematuria.  Musculoskeletal: Negative for arthralgias and myalgias.  Skin: Negative for pallor and rash.  Neurological: Negative for weakness and headaches.  Hematological: Negative for adenopathy.     Past Medical History: Past Medical History:  Diagnosis Date  . Community acquired pneumonia 1977   lingula  . GERD (gastroesophageal reflux disease)   . HTN (hypertension)   . Hyperlipidemia       Past Surgical History: Past Surgical History:  Procedure Laterality Date  . COLONOSCOPY     X 3; all negative; Dr Deatra Ina (last 12/13)  . TONSILLECTOMY AND ADENOIDECTOMY    . UPPER GASTROINTESTINAL ENDOSCOPY     gastritis  . VASECTOMY  1987     Family History: Family History  Problem Relation Age of Onset  . Stroke Paternal Aunt 62  . Testicular cancer Paternal Uncle   . Diabetes Unknown        paternal great uncle  . Colon cancer Other        PGGF  . Colon cancer Other        Paternal great uncle   . Colon cancer Maternal Grandfather   . Dementia Mother   . Heart disease Neg Hx     Social History: Social History   Socioeconomic History  . Marital status: Married    Spouse name: Not on file  . Number of children: 4  . Years of education: Not on file  . Highest education level: Not on file  Occupational History  . Occupation: Glass blower/designer  . Financial resource strain: Not on file  . Food insecurity:    Worry: Not on file    Inability: Not on file  . Transportation needs:    Medical: Not on file    Non-medical: Not on file  Tobacco Use  . Smoking status: Never Smoker  . Smokeless tobacco: Never Used  Substance and Sexual Activity  . Alcohol use: Yes    Alcohol/week: 4.2 oz    Types: 7 Glasses of wine per week  Comment:    . Drug use: No  . Sexual activity: Not on file  Lifestyle  . Physical activity:    Days per week: Not on file    Minutes per session: Not on file  . Stress: Not on file  Relationships  . Social connections:    Talks on phone: Not on file    Gets together: Not on file    Attends religious service: Not on file    Active member of club or organization: Not on file    Attends meetings of clubs or organizations: Not on file    Relationship status: Not on file  Other Topics Concern  . Not on file  Social History Narrative  . Not on file    Allergies: No Known Allergies  Outpatient Meds: Current Outpatient  Medications  Medication Sig Dispense Refill  . amLODipine (NORVASC) 5 MG tablet Take 1 tablet (5 mg total) by mouth daily. 90 tablet 3  . metoprolol succinate (TOPROL-XL) 50 MG 24 hr tablet Take 1 tablet (50 mg total) by mouth daily. Take with or immediately following a meal. 90 tablet 3  . metroNIDAZOLE (METROCREAM) 0.75 % cream Apply topically 2 (two) times daily.     No current facility-administered medications for this visit.       ___________________________________________________________________ Objective   Exam:  BP 130/80   Pulse 76   Ht 5\' 7"  (1.702 m)   Wt 202 lb (91.6 kg)   BMI 31.64 kg/m    General: this is a(n) well-appearing man  Eyes: sclera anicteric, no redness  ENT: oral mucosa moist without lesions, no cervical or supraclavicular lymphadenopathy, good dentition  CV: RRR without murmur, S1/S2, no JVD, no peripheral edema  Resp: clear to auscultation bilaterally, normal RR and effort noted  GI: soft, no tenderness, with active bowel sounds. No guarding or palpable organomegaly noted.  Skin; warm and dry, no rash or jaundice noted  Neuro: awake, alert and oriented x 3. Normal gross motor function and fluent speech Rectal - normal, heme neg formed stool  Labs:  CBC Latest Ref Rng & Units 09/13/2017 08/13/2016 08/06/2014  WBC 4.0 - 10.5 K/uL 7.3 9.3 8.6  Hemoglobin 13.0 - 17.0 g/dL 15.4 14.5 15.1  Hematocrit 39.0 - 52.0 % 44.6 42.6 44.1  Platelets 150.0 - 400.0 K/uL 294.0 320.0 311.0   CMP Latest Ref Rng & Units 09/13/2017 08/13/2016 08/12/2015  Glucose 70 - 99 mg/dL 106(H) 107(H) 121(H)  BUN 6 - 23 mg/dL 19 20 16   Creatinine 0.40 - 1.50 mg/dL 1.01 1.02 0.97  Sodium 135 - 145 mEq/L 139 140 140  Potassium 3.5 - 5.1 mEq/L 4.2 4.3 4.2  Chloride 96 - 112 mEq/L 100 102 102  CO2 19 - 32 mEq/L 29 31 29   Calcium 8.4 - 10.5 mg/dL 9.8 9.8 9.4  Total Protein 6.0 - 8.3 g/dL 6.9 7.0 7.1  Total Bilirubin 0.2 - 1.2 mg/dL 0.7 0.6 0.8  Alkaline Phos 39 - 117 U/L 59  56 53  AST 0 - 37 U/L 17 19 24   ALT 0 - 53 U/L 18 22 28      Radiologic Studies:  No recent abdominal imaging  Assessment: Encounter Diagnosis  Name Primary?  . Change in bowel habits Yes    He has had a change in bowel habits that sounds benign.  It may be related to dietary changes.  He has no abdominal pain, dyschezia or rectal bleeding.  He is heme-negative with normal hemoglobin.  He had  concerned about his family history of colon cancer, but he is still considered to be at average risk.  I do not feel colonoscopy is necessary at this point.  I will be glad to reevaluate him if he develops chronic abdominal pain, rectal bleeding, or any other concerns.  Otherwise, he would next be due for a routine colonoscopy November 2023.  Thank you for the courtesy of this consult.  Please call me with any questions or concerns.  Nelida Meuse III  CC: Hoyt Koch, MD

## 2017-11-17 NOTE — Patient Instructions (Signed)
If you are age 68 or older, your body mass index should be between 23-30. Your Body mass index is 31.64 kg/m. If this is out of the aforementioned range listed, please consider follow up with your Primary Care Provider.  If you are age 82 or younger, your body mass index should be between 19-25. Your Body mass index is 31.64 kg/m. If this is out of the aformentioned range listed, please consider follow up with your Primary Care Provider.   It was a pleasure to meet you today!  Dr. Loletha Carrow

## 2017-11-19 ENCOUNTER — Ambulatory Visit: Payer: Medicare Other | Admitting: Gastroenterology

## 2018-03-08 ENCOUNTER — Other Ambulatory Visit: Payer: Self-pay | Admitting: Internal Medicine

## 2018-03-08 DIAGNOSIS — I10 Essential (primary) hypertension: Secondary | ICD-10-CM

## 2018-04-01 DIAGNOSIS — Z23 Encounter for immunization: Secondary | ICD-10-CM | POA: Diagnosis not present

## 2018-04-11 ENCOUNTER — Encounter: Payer: Self-pay | Admitting: Internal Medicine

## 2018-04-19 ENCOUNTER — Other Ambulatory Visit: Payer: Self-pay

## 2018-09-19 ENCOUNTER — Other Ambulatory Visit: Payer: Self-pay | Admitting: Internal Medicine

## 2018-09-19 DIAGNOSIS — I10 Essential (primary) hypertension: Secondary | ICD-10-CM

## 2018-10-31 ENCOUNTER — Encounter: Payer: Self-pay | Admitting: Internal Medicine

## 2018-12-26 ENCOUNTER — Other Ambulatory Visit: Payer: Self-pay | Admitting: Internal Medicine

## 2018-12-26 DIAGNOSIS — I10 Essential (primary) hypertension: Secondary | ICD-10-CM

## 2019-01-09 ENCOUNTER — Other Ambulatory Visit: Payer: Self-pay

## 2019-01-09 DIAGNOSIS — Z20822 Contact with and (suspected) exposure to covid-19: Secondary | ICD-10-CM

## 2019-01-10 ENCOUNTER — Encounter: Payer: Self-pay | Admitting: Internal Medicine

## 2019-01-10 LAB — NOVEL CORONAVIRUS, NAA: SARS-CoV-2, NAA: NOT DETECTED

## 2019-02-01 ENCOUNTER — Other Ambulatory Visit: Payer: Self-pay

## 2019-02-01 ENCOUNTER — Ambulatory Visit (INDEPENDENT_AMBULATORY_CARE_PROVIDER_SITE_OTHER): Payer: Medicare Other

## 2019-02-01 DIAGNOSIS — Z23 Encounter for immunization: Secondary | ICD-10-CM | POA: Diagnosis not present

## 2019-03-01 ENCOUNTER — Other Ambulatory Visit (INDEPENDENT_AMBULATORY_CARE_PROVIDER_SITE_OTHER): Payer: Medicare Other

## 2019-03-01 ENCOUNTER — Encounter: Payer: Self-pay | Admitting: Internal Medicine

## 2019-03-01 ENCOUNTER — Other Ambulatory Visit: Payer: Self-pay

## 2019-03-01 ENCOUNTER — Ambulatory Visit (INDEPENDENT_AMBULATORY_CARE_PROVIDER_SITE_OTHER): Payer: Medicare Other | Admitting: Internal Medicine

## 2019-03-01 VITALS — BP 150/90 | HR 59 | Temp 98.8°F | Ht 67.0 in | Wt 211.0 lb

## 2019-03-01 DIAGNOSIS — Z0000 Encounter for general adult medical examination without abnormal findings: Secondary | ICD-10-CM

## 2019-03-01 DIAGNOSIS — Z8042 Family history of malignant neoplasm of prostate: Secondary | ICD-10-CM | POA: Diagnosis not present

## 2019-03-01 DIAGNOSIS — I10 Essential (primary) hypertension: Secondary | ICD-10-CM

## 2019-03-01 DIAGNOSIS — Z800 Family history of malignant neoplasm of digestive organs: Secondary | ICD-10-CM

## 2019-03-01 DIAGNOSIS — N400 Enlarged prostate without lower urinary tract symptoms: Secondary | ICD-10-CM

## 2019-03-01 LAB — CBC
HCT: 42.4 % (ref 39.0–52.0)
Hemoglobin: 14 g/dL (ref 13.0–17.0)
MCHC: 33.1 g/dL (ref 30.0–36.0)
MCV: 91.2 fl (ref 78.0–100.0)
Platelets: 271 10*3/uL (ref 150.0–400.0)
RBC: 4.65 Mil/uL (ref 4.22–5.81)
RDW: 13.9 % (ref 11.5–15.5)
WBC: 7.7 10*3/uL (ref 4.0–10.5)

## 2019-03-01 LAB — LIPID PANEL
Cholesterol: 203 mg/dL — ABNORMAL HIGH (ref 0–200)
HDL: 46.4 mg/dL (ref >39.00)
NonHDL: 157.01
Total CHOL/HDL Ratio: 4
Triglycerides: 204 mg/dL — ABNORMAL HIGH (ref 0.0–149.0)
VLDL: 40.8 mg/dL — ABNORMAL HIGH (ref 0.0–40.0)

## 2019-03-01 LAB — COMPREHENSIVE METABOLIC PANEL
ALT: 20 U/L (ref 0–53)
AST: 22 U/L (ref 0–37)
Albumin: 4.4 g/dL (ref 3.5–5.2)
Alkaline Phosphatase: 54 U/L (ref 39–117)
BUN: 22 mg/dL (ref 6–23)
CO2: 31 mEq/L (ref 19–32)
Calcium: 9.9 mg/dL (ref 8.4–10.5)
Chloride: 102 mEq/L (ref 96–112)
Creatinine, Ser: 0.89 mg/dL (ref 0.40–1.50)
GFR: 84.78 mL/min (ref >60.00)
Glucose, Bld: 101 mg/dL — ABNORMAL HIGH (ref 70–99)
Potassium: 4.9 mEq/L (ref 3.5–5.1)
Sodium: 140 mEq/L (ref 135–145)
Total Bilirubin: 1 mg/dL (ref 0.2–1.2)
Total Protein: 6.8 g/dL (ref 6.0–8.3)

## 2019-03-01 LAB — LDL CHOLESTEROL, DIRECT: Direct LDL: 127 mg/dL

## 2019-03-01 LAB — PSA: PSA: 3.35 ng/mL (ref 0.10–4.00)

## 2019-03-01 MED ORDER — METOPROLOL SUCCINATE ER 50 MG PO TB24: 50.0000 mg | ORAL_TABLET | Freq: Every day | ORAL | 3 refills | Status: DC

## 2019-03-01 MED ORDER — AMLODIPINE BESYLATE 5 MG PO TABS: 5.0000 mg | ORAL_TABLET | Freq: Every day | ORAL | 3 refills | Status: DC

## 2019-03-01 NOTE — Assessment & Plan Note (Signed)
Okay with waiting until 2023 given several normal colonoscopy although there is significant family history.

## 2019-03-01 NOTE — Patient Instructions (Signed)

## 2019-03-01 NOTE — Assessment & Plan Note (Signed)
Flu shot completed for season. Pneumonia complete. Shingrix counseled. Tetanus due 2022. Colonoscopy due 2023. Counseled about sun safety and mole surveillance. Counseled about the dangers of distracted driving. Given 10 year screening recommendations.

## 2019-03-01 NOTE — Progress Notes (Signed)
Subjective:   Patient ID: Albert Walls, male    DOB: 1951/04/21, 68 y.o.   MRN: [id number]  HPI Here for medicare wellness, no new complaints. Please see A/P for status and treatment of chronic medical problems.   HPI #2: Here for follow up blood pressure (BP at goal on metoprolol and amlodipine, denies headaches or chest pains, denies side effects), family history of prostate cancer (1 member, denies urinary symptoms, denies weight loss, no blood in urine, denies problems with starting and stopping urination or frequency) and concerns about family history of colon cancer (4-5 family members with colon cancer going back last 100 years or so, some discovered through [email or web address], saw GI last year and they did not recommend repeat until 2023 as spot hemoccult was negative at that time, he wants to know what I think about this recommendation).   Diet: heart healthy  Physical activity: sedentary Depression/mood screen: negative Hearing: intact to whispered voice Visual acuity: grossly normal, performs annual eye exam  ADLs: capable Fall risk: none Home safety: good Cognitive evaluation: intact to orientation, naming, recall and repetition EOL planning: adv directives discussed    Office Visit from 03/01/2019 in [place]  PHQ-2 Total Score  0      I have personally reviewed and have noted 1. The patient's medical and social history - reviewed today no changes 2. Their use of alcohol, tobacco or illicit drugs 3. Their current medications and supplements 4. The patient's functional ability including ADL's, fall risks, home safety risks and hearing or visual impairment. 5. Diet and physical activities 6. Evidence for depression or mood disorders 7. Care team reviewed and updated  Patient Care Team: Hoyt Koch, MD as PCP - General (Internal Medicine) Past Medical History:  Diagnosis Date  . Community acquired pneumonia 1977   lingula  . GERD  (gastroesophageal reflux disease)   . HTN (hypertension)   . Hyperlipidemia    Past Surgical History:  Procedure Laterality Date  . COLONOSCOPY     X 3; all negative; Dr Deatra Ina (last 12/13)  . TONSILLECTOMY AND ADENOIDECTOMY    . UPPER GASTROINTESTINAL ENDOSCOPY     gastritis  . VASECTOMY  1987   Family History  Problem Relation Age of Onset  . Stroke Paternal Aunt 62  . Testicular cancer Paternal Uncle   . Diabetes Unknown        paternal great uncle  . Colon cancer Other        PGGF  . Colon cancer Other        Paternal great uncle   . Colon cancer Maternal Grandfather   . Dementia Mother   . Heart disease Neg Hx    Review of Systems  Constitutional: Negative.   HENT: Negative.   Eyes: Negative.   Respiratory: Negative for cough, chest tightness and shortness of breath.   Cardiovascular: Negative for chest pain, palpitations and leg swelling.  Gastrointestinal: Negative for abdominal distention, abdominal pain, constipation, diarrhea, nausea and vomiting.  Musculoskeletal: Negative.   Skin: Negative.   Neurological: Negative.   Psychiatric/Behavioral: Negative.     Objective:  Physical Exam Constitutional:      Appearance: He is well-developed.  HENT:     Head: Normocephalic and atraumatic.  Neck:     Musculoskeletal: Normal range of motion.  Cardiovascular:     Rate and Rhythm: Normal rate and regular rhythm.  Pulmonary:     Effort: Pulmonary effort is normal. No respiratory distress.  Breath sounds: Normal breath sounds. No wheezing or rales.  Abdominal:     General: Bowel sounds are normal. There is no distension.     Palpations: Abdomen is soft.     Tenderness: There is no abdominal tenderness. There is no rebound.  Skin:    General: Skin is warm and dry.  Neurological:     Mental Status: He is alert and oriented to person, place, and time.     Coordination: Coordination normal.     Vitals:   03/01/19 0926 03/01/19 1019  BP: (!) 162/90 (!)  150/90  Pulse: (!) 59   Temp: 98.8 F (37.1 C)   TempSrc: Oral   SpO2: 97%   Weight: 211 lb (95.7 kg)   Height: 5\' 7"  (1.702 m)    EKG: Rate 56, axis normal, interval normal, sinus brady, no st or t wave change, no significant change compared to 2013  Assessment & Plan:

## 2019-03-01 NOTE — Assessment & Plan Note (Signed)
Checking PSA. Was again counseled about the potential benefit versus risk of PSA monitoring.

## 2019-03-01 NOTE — Assessment & Plan Note (Signed)
BP at goal on metoprolol and amlodipine. Checking CMP and adjust as needed. EKG done today without change from prior.

## 2019-06-28 ENCOUNTER — Ambulatory Visit: Payer: Medicare Other

## 2019-07-07 ENCOUNTER — Ambulatory Visit: Payer: Medicare Other | Attending: Internal Medicine

## 2019-07-07 DIAGNOSIS — Z23 Encounter for immunization: Secondary | ICD-10-CM | POA: Insufficient documentation

## 2019-07-07 NOTE — Progress Notes (Signed)
   Covid-19 Vaccination Clinic  Name:  Albert Walls    MRN: [id number] DOB: [birth year]  07/07/2019  [name] was observed post Covid-19 immunization for 15 minutes without incidence. He was provided with Vaccine Information Sheet and instruction to access the V-Safe system.   [name] was instructed to call 911 with any severe reactions post vaccine: Marland Kitchen Difficulty breathing  . Swelling of your face and throat  . A fast heartbeat  . A bad rash all over your body  . Dizziness and weakness    Immunizations Administered    Name Date Dose VIS Date Route   Pfizer COVID-19 Vaccine 07/07/2019  9:16 AM 0.3 mL 05/12/2019 Intramuscular   Manufacturer: [place]   Lot: [id number]   [place]: [id number]

## 2019-07-19 ENCOUNTER — Ambulatory Visit: Payer: Medicare Other

## 2019-08-01 ENCOUNTER — Ambulatory Visit: Payer: Medicare Other | Attending: Internal Medicine

## 2019-08-01 DIAGNOSIS — Z23 Encounter for immunization: Secondary | ICD-10-CM | POA: Insufficient documentation

## 2019-08-01 NOTE — Progress Notes (Signed)
   Covid-19 Vaccination Clinic  Name:  Albert Walls    MRN: [id number] DOB: [birth year]  08/01/2019  [name] was observed post Covid-19 immunization for 15 minutes without incident. He was provided with Vaccine Information Sheet and instruction to access the V-Safe system.   [name] was instructed to call 911 with any severe reactions post vaccine: Marland Kitchen Difficulty breathing  . Swelling of face and throat  . A fast heartbeat  . A bad rash all over body  . Dizziness and weakness   Immunizations Administered    Name Date Dose VIS Date Route   Pfizer COVID-19 Vaccine 08/01/2019 10:10 AM 0.3 mL 05/12/2019 Intramuscular   Manufacturer: [place]   Lot: [id number]   [place]: [id number]

## 2019-08-30 ENCOUNTER — Encounter: Payer: Self-pay | Admitting: Internal Medicine

## 2019-11-30 ENCOUNTER — Encounter: Payer: Self-pay | Admitting: Internal Medicine

## 2020-01-01 ENCOUNTER — Encounter: Payer: Self-pay | Admitting: Internal Medicine

## 2020-01-02 DIAGNOSIS — H40013 Open angle with borderline findings, low risk, bilateral: Secondary | ICD-10-CM | POA: Diagnosis not present

## 2020-01-20 DIAGNOSIS — Z23 Encounter for immunization: Secondary | ICD-10-CM | POA: Diagnosis not present

## 2020-02-06 DIAGNOSIS — Z23 Encounter for immunization: Secondary | ICD-10-CM | POA: Diagnosis not present

## 2020-02-09 DIAGNOSIS — Z1152 Encounter for screening for COVID-19: Secondary | ICD-10-CM | POA: Diagnosis not present

## 2020-04-01 ENCOUNTER — Other Ambulatory Visit: Payer: Self-pay | Admitting: Internal Medicine

## 2020-04-01 DIAGNOSIS — I10 Essential (primary) hypertension: Secondary | ICD-10-CM

## 2020-04-04 ENCOUNTER — Encounter: Payer: Medicare Other | Admitting: Internal Medicine

## 2020-04-11 ENCOUNTER — Encounter: Payer: Medicare Other | Admitting: Internal Medicine

## 2020-05-08 ENCOUNTER — Telehealth: Payer: Self-pay | Admitting: Internal Medicine

## 2020-05-08 NOTE — Telephone Encounter (Signed)
Patient called and said that he is leaving for [place] on 06/04/2020 and is needing a a PCR test before entering the country he was wondering if Dr. Sharlet Salina would put the order in. He can be reached at [phone]

## 2020-05-09 NOTE — Telephone Encounter (Signed)
We do not have that ability. He can get testing done through a number of sites but we cannot do that here.

## 2020-05-13 NOTE — Telephone Encounter (Signed)
Left detailed message informing pt of below.  

## 2020-05-27 ENCOUNTER — Encounter: Payer: Medicare Other | Admitting: Internal Medicine

## 2020-06-24 ENCOUNTER — Other Ambulatory Visit: Payer: Self-pay

## 2020-06-24 ENCOUNTER — Encounter: Payer: Self-pay | Admitting: Internal Medicine

## 2020-06-24 ENCOUNTER — Ambulatory Visit (INDEPENDENT_AMBULATORY_CARE_PROVIDER_SITE_OTHER): Payer: Medicare Other | Admitting: Internal Medicine

## 2020-06-24 VITALS — BP 126/76 | HR 67 | Temp 99.1°F | Resp 18 | Ht 67.0 in | Wt 212.4 lb

## 2020-06-24 DIAGNOSIS — I10 Essential (primary) hypertension: Secondary | ICD-10-CM

## 2020-06-24 DIAGNOSIS — Z1211 Encounter for screening for malignant neoplasm of colon: Secondary | ICD-10-CM

## 2020-06-24 DIAGNOSIS — Z8042 Family history of malignant neoplasm of prostate: Secondary | ICD-10-CM | POA: Diagnosis not present

## 2020-06-24 DIAGNOSIS — Z800 Family history of malignant neoplasm of digestive organs: Secondary | ICD-10-CM | POA: Diagnosis not present

## 2020-06-24 DIAGNOSIS — Z0000 Encounter for general adult medical examination without abnormal findings: Secondary | ICD-10-CM

## 2020-06-24 NOTE — Progress Notes (Signed)
Subjective:   Patient ID: Albert Walls, male    DOB: [birth year], 70 y.o.   MRN: [id number]  HPI Here for medicare wellness and physical, no new complaints. Please see A/P for status and treatment of chronic medical problems.   Diet: heart healthy Physical activity: sedentary Depression/mood screen: negative Hearing: intact to whispered voice, mild loss bilateral Visual acuity: grossly normal, performs annual eye exam  ADLs: capable Fall risk: none Home safety: good Cognitive evaluation: intact to orientation, naming, recall and repetition EOL planning: adv directives discussed  [place] Visit from 06/24/2020 in [place] at [facility] Total Score 0       I have personally reviewed and have noted 1. The patient's medical and social history - reviewed today no changes 2. Their use of alcohol, tobacco or illicit drugs 3. Their current medications and supplements 4. The patient's functional ability including ADL's, fall risks, home safety risks and hearing or visual impairment. 5. Diet and physical activities 6. Evidence for depression or mood disorders 7. Care team reviewed and updated  Patient Care Team: Hoyt Koch, MD as PCP - General (Internal Medicine) Past Medical History:  Diagnosis Date  . Community acquired pneumonia 1977   lingula  . GERD (gastroesophageal reflux disease)   . HTN (hypertension)   . Hyperlipidemia    Past Surgical History:  Procedure Laterality Date  . COLONOSCOPY     X 3; all negative; Dr Deatra Ina (last 12/13)  . TONSILLECTOMY AND ADENOIDECTOMY    . UPPER GASTROINTESTINAL ENDOSCOPY     gastritis  . VASECTOMY  1987   Family History  Problem Relation Age of Onset  . Stroke Paternal Aunt 62  . Testicular cancer Paternal Uncle   . Diabetes Unknown        paternal great uncle  . Colon cancer Other        PGGF  . Colon cancer Other        Paternal great uncle   . Colon cancer Maternal Grandfather    . Dementia Mother   . Heart disease Neg Hx      Review of Systems  Constitutional: Negative.   HENT: Negative.   Eyes: Negative.   Respiratory: Negative for cough, chest tightness and shortness of breath.   Cardiovascular: Negative for chest pain, palpitations and leg swelling.  Gastrointestinal: Negative for abdominal distention, abdominal pain, constipation, diarrhea, nausea and vomiting.  Musculoskeletal: Negative.   Skin: Negative.   Neurological: Negative.   Psychiatric/Behavioral: Negative.     Objective:  Physical Exam Constitutional:      Appearance: He is well-developed and well-nourished.  HENT:     Head: Normocephalic and atraumatic.  Eyes:     Extraocular Movements: EOM normal.  Cardiovascular:     Rate and Rhythm: Normal rate and regular rhythm.  Pulmonary:     Effort: Pulmonary effort is normal. No respiratory distress.     Breath sounds: Normal breath sounds. No wheezing or rales.  Abdominal:     General: Bowel sounds are normal. There is no distension.     Palpations: Abdomen is soft.     Tenderness: There is no abdominal tenderness. There is no rebound.  Musculoskeletal:        General: No edema.     Cervical back: Normal range of motion.  Skin:    General: Skin is warm and dry.  Neurological:     Mental Status: He is alert and oriented to person, place, and  time.     Coordination: Coordination normal.  Psychiatric:        Mood and Affect: Mood and affect normal.     Vitals:   06/24/20 1354  BP: 126/76  Pulse: 67  Resp: 18  Temp: 99.1 F (37.3 C)  TempSrc: Oral  SpO2: 97%  Weight: 212 lb 6.4 oz (96.3 kg)  Height: 5\' 7"  (1.702 m)    This visit occurred during the SARS-CoV-2 public health emergency.  Safety protocols were in place, including screening questions prior to the visit, additional usage of staff PPE, and extensive cleaning of exam room while observing appropriate contact time as indicated for disinfecting solutions.    Assessment & Plan:

## 2020-06-24 NOTE — Patient Instructions (Signed)
We will get the cologuard sent to the house.   Health Maintenance, Male Adopting a healthy lifestyle and getting preventive care are important in promoting health and wellness. Ask your health care provider about:  The right schedule for you to have regular tests and exams.  Things you can do on your own to prevent diseases and keep yourself healthy. What should I know about diet, weight, and exercise? Eat a healthy diet  Eat a diet that includes plenty of vegetables, fruits, low-fat dairy products, and lean protein.  Do not eat a lot of foods that are high in solid fats, added sugars, or sodium.   Maintain a healthy weight Body mass index (BMI) is a measurement that can be used to identify possible weight problems. It estimates body fat based on height and weight. Your health care provider can help determine your BMI and help you achieve or maintain a healthy weight. Get regular exercise Get regular exercise. This is one of the most important things you can do for your health. Most adults should:  Exercise for at least 150 minutes each week. The exercise should increase your heart rate and make you sweat (moderate-intensity exercise).  Do strengthening exercises at least twice a week. This is in addition to the moderate-intensity exercise.  Spend less time sitting. Even light physical activity can be beneficial. Watch cholesterol and blood lipids Have your blood tested for lipids and cholesterol at 71 years of age, then have this test every 5 years. You may need to have your cholesterol levels checked more often if:  Your lipid or cholesterol levels are high.  You are older than 71 years of age.  You are at high risk for heart disease. What should I know about cancer screening? Many types of cancers can be detected early and may often be prevented. Depending on your health history and family history, you may need to have cancer screening at various ages. This may include screening  for:  Colorectal cancer.  Prostate cancer.  Skin cancer.  Lung cancer. What should I know about heart disease, diabetes, and high blood pressure? Blood pressure and heart disease  High blood pressure causes heart disease and increases the risk of stroke. This is more likely to develop in people who have high blood pressure readings, are of African descent, or are overweight.  Talk with your health care provider about your target blood pressure readings.  Have your blood pressure checked: ? Every 3-5 years if you are 47-74 years of age. ? Every year if you are 63 years old or older.  If you are between the ages of 71 and 40 and are a current or former smoker, ask your health care provider if you should have a one-time screening for abdominal aortic aneurysm (AAA). Diabetes Have regular diabetes screenings. This checks your fasting blood sugar level. Have the screening done:  Once every three years after age 13 if you are at a normal weight and have a low risk for diabetes.  More often and at a younger age if you are overweight or have a high risk for diabetes. What should I know about preventing infection? Hepatitis B If you have a higher risk for hepatitis B, you should be screened for this virus. Talk with your health care provider to find out if you are at risk for hepatitis B infection. Hepatitis C Blood testing is recommended for:  Everyone born from [birth year] through 1966.  Anyone with known risk factors for hepatitis  C. Sexually transmitted infections (STIs)  You should be screened each year for STIs, including gonorrhea and chlamydia, if: ? You are sexually active and are younger than 71 years of age. ? You are older than 71 years of age and your health care provider tells you that you are at risk for this type of infection. ? Your sexual activity has changed since you were last screened, and you are at increased risk for chlamydia or gonorrhea. Ask your health care  provider if you are at risk.  Ask your health care provider about whether you are at high risk for HIV. Your health care provider may recommend a prescription medicine to help prevent HIV infection. If you choose to take medicine to prevent HIV, you should first get tested for HIV. You should then be tested every 3 months for as long as you are taking the medicine. Follow these instructions at home: Lifestyle  Do not use any products that contain nicotine or tobacco, such as cigarettes, e-cigarettes, and chewing tobacco. If you need help quitting, ask your health care provider.  Do not use street drugs.  Do not share needles.  Ask your health care provider for help if you need support or information about quitting drugs. Alcohol use  Do not drink alcohol if your health care provider tells you not to drink.  If you drink alcohol: ? Limit how much you have to 0-2 drinks a day. ? Be aware of how much alcohol is in your drink. In the [place] one drink equals one 12 oz bottle of beer (355 mL), one 5 oz glass of wine (148 mL), or one 1 oz glass of hard liquor (44 mL). General instructions  Schedule regular health, dental, and eye exams.  Stay current with your vaccines.  Tell your health care provider if: ? You often feel depressed. ? You have ever been abused or do not feel safe at home. Summary  Adopting a healthy lifestyle and getting preventive care are important in promoting health and wellness.  Follow your health care provider's instructions about healthy diet, exercising, and getting tested or screened for diseases.  Follow your health care provider's instructions on monitoring your cholesterol and blood pressure. This information is not intended to replace advice given to you by your health care provider. Make sure you discuss any questions you have with your health care provider. Document Revised: 05/11/2018 Document Reviewed: 05/11/2018 Elsevier Patient Education  2021  [employer].

## 2020-06-26 NOTE — Assessment & Plan Note (Signed)
Will come for labs in a few months. Taking amlodipine and metoprolol and HR at goal. Checking CMP for complications and adjust as needed. BP at goal.

## 2020-06-26 NOTE — Assessment & Plan Note (Signed)
Checking PSA with labs.

## 2020-06-26 NOTE — Assessment & Plan Note (Signed)
Flu shot up to date. Covid-19 up to date including booster. Pneumonia complete. Shingrix counseled got 1 dose. Tetanus due 2022. Cologuard ordered today. Counseled about sun safety and mole surveillance. Counseled about the dangers of distracted driving. Given 10 year screening recommendations.

## 2020-06-26 NOTE — Assessment & Plan Note (Signed)
Ordering cologuard for screening purposes.

## 2020-07-16 ENCOUNTER — Other Ambulatory Visit: Payer: Self-pay | Admitting: Internal Medicine

## 2020-07-16 DIAGNOSIS — I10 Essential (primary) hypertension: Secondary | ICD-10-CM

## 2020-07-24 DIAGNOSIS — Z1211 Encounter for screening for malignant neoplasm of colon: Secondary | ICD-10-CM | POA: Diagnosis not present

## 2020-07-25 LAB — COLOGUARD: Cologuard: NEGATIVE

## 2020-07-31 LAB — COLOGUARD: COLOGUARD: NEGATIVE

## 2020-08-07 ENCOUNTER — Encounter: Payer: Self-pay | Admitting: Internal Medicine

## 2020-09-05 ENCOUNTER — Encounter: Payer: Self-pay | Admitting: Internal Medicine

## 2020-09-12 ENCOUNTER — Encounter: Payer: Self-pay | Admitting: Internal Medicine

## 2020-10-13 ENCOUNTER — Other Ambulatory Visit: Payer: Self-pay | Admitting: Internal Medicine

## 2020-10-13 DIAGNOSIS — I10 Essential (primary) hypertension: Secondary | ICD-10-CM

## 2020-12-30 ENCOUNTER — Other Ambulatory Visit: Payer: Self-pay | Admitting: Internal Medicine

## 2020-12-30 DIAGNOSIS — I10 Essential (primary) hypertension: Secondary | ICD-10-CM

## 2020-12-31 ENCOUNTER — Telehealth: Payer: Self-pay

## 2020-12-31 ENCOUNTER — Other Ambulatory Visit: Payer: Self-pay | Admitting: Internal Medicine

## 2020-12-31 DIAGNOSIS — I10 Essential (primary) hypertension: Secondary | ICD-10-CM

## 2020-12-31 NOTE — Telephone Encounter (Signed)
Medication has been refilled.

## 2020-12-31 NOTE — Telephone Encounter (Signed)
pt has stated he is going away for 2 months and needs a medication refill for his Amlodipine and Metoprolol as he is leaving on Thursday.

## 2021-03-02 ENCOUNTER — Encounter: Payer: Self-pay | Admitting: Internal Medicine

## 2021-03-06 DIAGNOSIS — H40053 Ocular hypertension, bilateral: Secondary | ICD-10-CM | POA: Diagnosis not present

## 2021-04-12 ENCOUNTER — Other Ambulatory Visit: Payer: Self-pay | Admitting: Internal Medicine

## 2021-04-12 DIAGNOSIS — I10 Essential (primary) hypertension: Secondary | ICD-10-CM

## 2021-05-14 DIAGNOSIS — H40013 Open angle with borderline findings, low risk, bilateral: Secondary | ICD-10-CM | POA: Diagnosis not present

## 2021-06-17 DIAGNOSIS — L719 Rosacea, unspecified: Secondary | ICD-10-CM | POA: Diagnosis not present

## 2021-06-17 DIAGNOSIS — L570 Actinic keratosis: Secondary | ICD-10-CM | POA: Diagnosis not present

## 2021-07-11 ENCOUNTER — Other Ambulatory Visit: Payer: Self-pay | Admitting: Internal Medicine

## 2021-07-11 DIAGNOSIS — I10 Essential (primary) hypertension: Secondary | ICD-10-CM

## 2021-08-01 ENCOUNTER — Ambulatory Visit (INDEPENDENT_AMBULATORY_CARE_PROVIDER_SITE_OTHER): Payer: Medicare Other | Admitting: Internal Medicine

## 2021-08-01 ENCOUNTER — Other Ambulatory Visit: Payer: Self-pay

## 2021-08-01 ENCOUNTER — Encounter: Payer: Self-pay | Admitting: Internal Medicine

## 2021-08-01 VITALS — BP 122/62 | HR 61 | Resp 18 | Ht 67.0 in | Wt 204.6 lb

## 2021-08-01 DIAGNOSIS — I10 Essential (primary) hypertension: Secondary | ICD-10-CM | POA: Diagnosis not present

## 2021-08-01 DIAGNOSIS — Z0000 Encounter for general adult medical examination without abnormal findings: Secondary | ICD-10-CM

## 2021-08-01 DIAGNOSIS — Z8042 Family history of malignant neoplasm of prostate: Secondary | ICD-10-CM

## 2021-08-01 LAB — CBC
HCT: 42.4 % (ref 39.0–52.0)
Hemoglobin: 14.3 g/dL (ref 13.0–17.0)
MCHC: 33.7 g/dL (ref 30.0–36.0)
MCV: 89.7 fl (ref 78.0–100.0)
Platelets: 257 10*3/uL (ref 150.0–400.0)
RBC: 4.72 Mil/uL (ref 4.22–5.81)
RDW: 13.7 % (ref 11.5–15.5)
WBC: 6.5 10*3/uL (ref 4.0–10.5)

## 2021-08-01 LAB — COMPREHENSIVE METABOLIC PANEL
ALT: 20 U/L (ref 0–53)
AST: 21 U/L (ref 0–37)
Albumin: 4.4 g/dL (ref 3.5–5.2)
Alkaline Phosphatase: 53 U/L (ref 39–117)
BUN: 23 mg/dL (ref 6–23)
CO2: 30 mEq/L (ref 19–32)
Calcium: 9.7 mg/dL (ref 8.4–10.5)
Chloride: 100 mEq/L (ref 96–112)
Creatinine, Ser: 1.02 mg/dL (ref 0.40–1.50)
GFR: 74.05 mL/min (ref >60.00)
Glucose, Bld: 94 mg/dL (ref 70–99)
Potassium: 4.3 mEq/L (ref 3.5–5.1)
Sodium: 139 mEq/L (ref 135–145)
Total Bilirubin: 1 mg/dL (ref 0.2–1.2)
Total Protein: 6.7 g/dL (ref 6.0–8.3)

## 2021-08-01 LAB — LIPID PANEL
Cholesterol: 239 mg/dL — ABNORMAL HIGH (ref 0–200)
HDL: 54.8 mg/dL (ref >39.00)
LDL Cholesterol: 154 mg/dL — ABNORMAL HIGH (ref 0–99)
NonHDL: 184.08
Total CHOL/HDL Ratio: 4
Triglycerides: 150 mg/dL — ABNORMAL HIGH (ref 0.0–149.0)
VLDL: 30 mg/dL (ref 0.0–40.0)

## 2021-08-01 LAB — HEMOGLOBIN A1C: Hgb A1c MFr Bld: 5.7 % (ref 4.6–6.5)

## 2021-08-01 LAB — PSA: PSA: 4.11 ng/mL — ABNORMAL HIGH (ref 0.10–4.00)

## 2021-08-01 MED ORDER — METOPROLOL SUCCINATE ER 50 MG PO TB24: 50.0000 mg | ORAL_TABLET | Freq: Every day | ORAL | 3 refills | Status: DC

## 2021-08-01 MED ORDER — AMLODIPINE BESYLATE 5 MG PO TABS: 5.0000 mg | ORAL_TABLET | Freq: Every day | ORAL | 3 refills | Status: DC

## 2021-08-01 NOTE — Progress Notes (Signed)
? ?Subjective:  ? ?Patient ID: Albert Walls, male    DOB: 1950-09-17, 71 y.o.   MRN: [id number] ? ?HPI ?Here for medicare wellness and physical, no new complaints. Please see A/P for status and treatment of chronic medical problems.  ? ?Diet: heart healthy ?Physical activity: sedentary ?Depression/mood screen: negative ?Hearing: intact to whispered voice ?Visual acuity: grossly normal, performs annual eye exam  ?ADLs: capable ?Fall risk: none ?Home safety: good ?Cognitive evaluation: intact to orientation, naming, recall and repetition ?EOL planning: adv directives discussed ? ?[place] Office Visit from 08/01/2021 in [place] at [facility]  ?PHQ-2 Total Score 0  ? ?  ?  ? ?Fall Risk 03/02/2017 04/19/2018 03/01/2019 06/24/2020 08/01/2021  ?Falls in the past year? No 0 0 0 0  ?Was there an injury with Fall? - - - 0 0  ?Fall Risk Category Calculator - - - 0 0  ?Fall Risk Category - - - Low Low  ? ? ?I have personally reviewed and have noted ?1. The patient's medical and social history - reviewed today no changes ?2. Their use of alcohol, tobacco or illicit drugs ?3. Their current medications and supplements ?4. The patient's functional ability including ADL's, fall risks, home safety risks and hearing or visual impairment. ?5. Diet and physical activities ?6. Evidence for depression or mood disorders ?7. Care team reviewed and updated ?8.  The patient is not on an opioid pain medication. ? ?Patient Care Team: ?Hoyt Koch, MD as PCP - General (Internal Medicine) ?Past Medical History:  ?Diagnosis Date  ? Community acquired pneumonia 1977  ? lingula  ? GERD (gastroesophageal reflux disease)   ? HTN (hypertension)   ? Hyperlipidemia   ? ?Past Surgical History:  ?Procedure Laterality Date  ? COLONOSCOPY    ? X 3; all negative; Dr Deatra Ina (last 12/13)  ? TONSILLECTOMY AND ADENOIDECTOMY    ? UPPER GASTROINTESTINAL ENDOSCOPY    ? gastritis  ? VASECTOMY  1987  ? ?Family History  ?Problem Relation Age of  Onset  ? Stroke Paternal Aunt 74  ? Testicular cancer Paternal Uncle   ? Diabetes Unknown   ?     paternal great uncle  ? Colon cancer Other   ?     PGGF  ? Colon cancer Other   ?     Paternal great uncle   ? Colon cancer Maternal Grandfather   ? Dementia Mother   ? Heart disease Neg Hx   ? ?Review of Systems  ?Constitutional: Negative.   ?HENT: Negative.    ?Eyes: Negative.   ?Respiratory:  Negative for cough, chest tightness and shortness of breath.   ?Cardiovascular:  Negative for chest pain, palpitations and leg swelling.  ?Gastrointestinal:  Negative for abdominal distention, abdominal pain, constipation, diarrhea, nausea and vomiting.  ?Musculoskeletal: Negative.   ?Skin: Negative.   ?Neurological: Negative.   ?Psychiatric/Behavioral: Negative.    ? ?Objective:  ?Physical Exam ?Constitutional:   ?   Appearance: He is well-developed.  ?HENT:  ?   Head: Normocephalic and atraumatic.  ?Neck:  ?   Vascular: No carotid bruit.  ?Cardiovascular:  ?   Rate and Rhythm: Normal rate and regular rhythm.  ?Pulmonary:  ?   Effort: Pulmonary effort is normal. No respiratory distress.  ?   Breath sounds: Normal breath sounds. No wheezing or rales.  ?Abdominal:  ?   General: Bowel sounds are normal. There is no distension.  ?   Palpations: Abdomen is soft.  ?  Tenderness: There is no abdominal tenderness. There is no rebound.  ?Musculoskeletal:  ?   Cervical back: Normal range of motion.  ?Skin: ?   General: Skin is warm and dry.  ?Neurological:  ?   Mental Status: He is alert and oriented to person, place, and time.  ?   Coordination: Coordination normal.  ? ? ?Vitals:  ? 08/01/21 0806  ?BP: 122/62  ?Pulse: 61  ?Resp: 18  ?SpO2: 98%  ?Weight: 204 lb 9.6 oz (92.8 kg)  ?Height: 5\' 7"  (1.702 m)  ? ?This visit occurred during the SARS-CoV-2 public health emergency.  Safety protocols were in place, including screening questions prior to the visit, additional usage of staff PPE, and extensive cleaning of exam room while observing  appropriate contact time as indicated for disinfecting solutions.  ? ?Assessment & Plan:  ? ?

## 2021-08-01 NOTE — Assessment & Plan Note (Signed)
BP at goal on amlodipine 5 mg daily and metoprolol 50 mg daily. Checking CMP and adjust as needed.  ?

## 2021-08-01 NOTE — Assessment & Plan Note (Signed)
Flu shot up to date. Covid-19 up to date. Pneumonia complete. Shingrix counseled. Tetanus due counseled to get at pharmacy. Cologuard up to date due 2025. Counseled about sun safety and mole surveillance. Counseled about the dangers of distracted driving. Given 10 year screening recommendations.  ? ?

## 2021-08-01 NOTE — Assessment & Plan Note (Signed)
Checking PSA today.

## 2021-08-04 ENCOUNTER — Encounter: Payer: Self-pay | Admitting: Internal Medicine

## 2021-08-04 DIAGNOSIS — Z8042 Family history of malignant neoplasm of prostate: Secondary | ICD-10-CM

## 2021-08-22 ENCOUNTER — Encounter: Payer: Self-pay | Admitting: Internal Medicine

## 2021-10-23 DIAGNOSIS — L821 Other seborrheic keratosis: Secondary | ICD-10-CM | POA: Diagnosis not present

## 2021-10-23 DIAGNOSIS — L719 Rosacea, unspecified: Secondary | ICD-10-CM | POA: Diagnosis not present

## 2021-10-23 DIAGNOSIS — L570 Actinic keratosis: Secondary | ICD-10-CM | POA: Diagnosis not present

## 2021-11-03 DIAGNOSIS — H40013 Open angle with borderline findings, low risk, bilateral: Secondary | ICD-10-CM | POA: Diagnosis not present

## 2021-11-25 ENCOUNTER — Encounter: Payer: Self-pay | Admitting: Internal Medicine

## 2021-12-15 ENCOUNTER — Telehealth: Payer: Self-pay | Admitting: Internal Medicine

## 2021-12-15 NOTE — Telephone Encounter (Signed)
[facility] called to request we fax over PSA results. We sent them the results from 03.03.23 but they would also like older results to compare.   Pt has an appointment tomorrow so please fax the results ASAP.  Phone: [phone] Fax: [phone]

## 2021-12-16 NOTE — Telephone Encounter (Signed)
Faxed over PSA results from 2018, 2019 and 2020. Confirmation fax has been received.

## 2022-04-01 ENCOUNTER — Encounter: Payer: Self-pay | Admitting: Gastroenterology

## 2022-04-14 ENCOUNTER — Encounter: Payer: Self-pay | Admitting: Gastroenterology

## 2022-05-04 DIAGNOSIS — H40013 Open angle with borderline findings, low risk, bilateral: Secondary | ICD-10-CM | POA: Diagnosis not present

## 2022-05-18 ENCOUNTER — Ambulatory Visit (AMBULATORY_SURGERY_CENTER): Payer: Self-pay

## 2022-05-18 VITALS — Ht 67.0 in | Wt 195.0 lb

## 2022-05-18 DIAGNOSIS — Z1211 Encounter for screening for malignant neoplasm of colon: Secondary | ICD-10-CM

## 2022-05-18 MED ORDER — NA SULFATE-K SULFATE-MG SULF 17.5-3.13-1.6 GM/177ML PO SOLN: 1.0000 | Freq: Once | ORAL | 0 refills | Status: AC

## 2022-05-18 NOTE — Progress Notes (Signed)
Pre visit completed via phone call; Patient verified name, DOB, and address;  No egg or soy allergy known to patient;  No issues known to pt with past sedation with any surgeries or procedures; Patient denies ever being told they had issues or difficulty with intubation;  No FH of Malignant Hyperthermia; Pt is not on diet pills; Pt is not on home 02;  Pt is not on blood thinners;  Pt denies issues with constipation;  No A fib or A flutter; Have any cardiac testing pending--NO Pt instructed to use Singlecare.com or GoodRx for a price reduction on prep;   Insurance verified during [place] appt=[employer] Medicare   Patient's chart reviewed by Osvaldo Angst CNRA prior to previsit and patient appropriate for the [place].  Previsit completed and red dot placed by patient's name on their procedure day (on provider's schedule).    [facility] GoodRx coupon sent with instructions via mail to the patient;

## 2022-06-05 ENCOUNTER — Encounter: Payer: Self-pay | Admitting: Gastroenterology

## 2022-06-10 ENCOUNTER — Encounter: Payer: Self-pay | Admitting: Gastroenterology

## 2022-06-10 ENCOUNTER — Ambulatory Visit (AMBULATORY_SURGERY_CENTER): Payer: Medicare Other | Admitting: Gastroenterology

## 2022-06-10 VITALS — BP 125/75 | HR 58 | Temp 98.6°F | Resp 11 | Ht 65.0 in | Wt 195.0 lb

## 2022-06-10 DIAGNOSIS — Z1211 Encounter for screening for malignant neoplasm of colon: Secondary | ICD-10-CM | POA: Diagnosis not present

## 2022-06-10 MED ORDER — SODIUM CHLORIDE 0.9 % IV SOLN: 500.0000 mL | Freq: Once | INTRAVENOUS | Status: DC

## 2022-06-10 NOTE — Patient Instructions (Signed)
   Handouts on diverticulosis & hemorrhoids given to you today  Dr Loletha Carrow gave pt handout on Hemorrhoidal banding     YOU HAD AN ENDOSCOPIC PROCEDURE TODAY AT [facility]:   Refer to the procedure report that was given to you for any specific questions about what was found during the examination.  If the procedure report does not answer your questions, please call your gastroenterologist to clarify.  If you requested that your care partner not be given the details of your procedure findings, then the procedure report has been included in a sealed envelope for you to review at your convenience later.  YOU SHOULD EXPECT: Some feelings of bloating in the abdomen. Passage of more gas than usual.  Walking can help get rid of the air that was put into your GI tract during the procedure and reduce the bloating. If you had a lower endoscopy (such as a colonoscopy or flexible sigmoidoscopy) you may notice spotting of blood in your stool or on the toilet paper. If you underwent a bowel prep for your procedure, you may not have a normal bowel movement for a few days.  Please Note:  You might notice some irritation and congestion in your nose or some drainage.  This is from the oxygen used during your procedure.  There is no need for concern and it should clear up in a day or so.  SYMPTOMS TO REPORT IMMEDIATELY:  Following lower endoscopy (colonoscopy or flexible sigmoidoscopy):  Excessive amounts of blood in the stool  Significant tenderness or worsening of abdominal pains  Swelling of the abdomen that is new, acute  Fever of 100F or higher  For urgent or emergent issues, a gastroenterologist can be reached at any hour by calling [phone]. Do not use MyChart messaging for urgent concerns.    DIET:  We do recommend a small meal at first, but then you may proceed to your regular diet.  Drink plenty of fluids but you should avoid alcoholic beverages for 24 hours.  ACTIVITY:  You  should plan to take it easy for the rest of today and you should NOT DRIVE or use heavy machinery until tomorrow (because of the sedation medicines used during the test).    FOLLOW UP: Our staff will call the number listed on your records the next business day following your procedure.  We will call around 7:15- 8:00 am to check on you and address any questions or concerns that you may have regarding the information given to you following your procedure. If we do not reach you, we will leave a message.     If any biopsies were taken you will be contacted by phone or by letter within the next 1-3 weeks.  Please call us at [phone] if you have not heard about the biopsies in 3 weeks.    SIGNATURES/CONFIDENTIALITY: You and/or your care partner have signed paperwork which will be entered into your electronic medical record.  These signatures attest to the fact that that the information above on your After Visit Summary has been reviewed and is understood.  Full responsibility of the confidentiality of this discharge information lies with you and/or your care-partner.

## 2022-06-10 NOTE — Progress Notes (Signed)
History and Physical:  This patient presents for endoscopic testing for: Encounter Diagnosis  Name Primary?   Colon cancer screening Yes    No polyps last colonoscopy Nov 2013 Reports HR bleeding about every other month. No changes in bowel habits  Patient is otherwise without complaints or active issues today.   Past Medical History: Past Medical History:  Diagnosis Date   Community acquired pneumonia 06/02/1975   lingula   GERD (gastroesophageal reflux disease)    hx of- diet controlled   HTN (hypertension)    on meds   Hyperlipidemia    diet controlled     Past Surgical History: Past Surgical History:  Procedure Laterality Date   COLONOSCOPY  2013   Dr. Kaplan-suprep(exc)normal -10 yr recall   TONSILLECTOMY AND ADENOIDECTOMY  1956   UPPER GASTROINTESTINAL ENDOSCOPY     gastritis   VASECTOMY  06/01/1985    Allergies: No Known Allergies  Outpatient Meds: Current Outpatient Medications  Medication Sig Dispense Refill   amLODipine (NORVASC) 5 MG tablet Take 1 tablet (5 mg total) by mouth daily. 90 tablet 3   metoprolol succinate (TOPROL-XL) 50 MG 24 hr tablet Take 1 tablet (50 mg total) by mouth daily. 90 tablet 3   Multiple Vitamins-Minerals (EMERGEN-C VITAMIN C PO) Take 1 packet by mouth daily as needed (as needed).     Current Facility-Administered Medications  Medication Dose Route Frequency Provider Last Rate Last Admin   0.9 %  sodium chloride infusion  500 mL Intravenous Once Nelida Meuse III, MD          ___________________________________________________________________ Objective   Exam:  BP (!) 151/102   Pulse 66   Temp 98.6 F (37 C)   Ht '5\' 5"'$  (1.651 m)   Wt 195 lb (88.5 kg)   SpO2 97%   BMI 32.45 kg/m   CV: regular , S1/S2 Resp: clear to auscultation bilaterally, normal RR and effort noted GI: soft, no tenderness, with active bowel sounds.   Assessment: Encounter Diagnosis  Name Primary?   Colon cancer screening Yes      Plan: Colonoscopy  The benefits and risks of the planned procedure were described in detail with the patient or (when appropriate) their health care proxy.  Risks were outlined as including, but not limited to, bleeding, infection, perforation, adverse medication reaction leading to cardiac or pulmonary decompensation, pancreatitis (if ERCP).  The limitation of incomplete mucosal visualization was also discussed.  No guarantees or warranties were given.    The patient is appropriate for an endoscopic procedure in the ambulatory setting.   - Wilfrid Lund, MD

## 2022-06-10 NOTE — Progress Notes (Signed)
To pacu, VSS. Report to [email or web address] 

## 2022-06-10 NOTE — Op Note (Signed)
[facility] Patient Name: Albert Walls Procedure Date: 06/10/2022 7:59 AM MRN: [id number] Endoscopist: Mallie Mussel L. Loletha Walls , MD, [id number] Age: 72 Referring MD:  Date of Birth: 1950-08-15 Gender: Male Account #: [id number] Procedure:                Colonoscopy Indications:              Screening for colorectal malignant neoplasm                           No polyps last colonoscopy November 2013 Medicines:                Monitored Anesthesia Care Procedure:                Pre-Anesthesia Assessment:                           - Prior to the procedure, a History and Physical                            was performed, and patient medications and                            allergies were reviewed. The patient's tolerance of                            previous anesthesia was also reviewed. The risks                            and benefits of the procedure and the sedation                            options and risks were discussed with the patient.                            All questions were answered, and informed consent                            was obtained. Prior Anticoagulants: The patient has                            taken no anticoagulant or antiplatelet agents. ASA                            Grade Assessment: II - A patient with mild systemic                            disease. After reviewing the risks and benefits,                            the patient was deemed in satisfactory condition to                            undergo the procedure.  After obtaining informed consent, the colonoscope                            was passed under direct vision. Throughout the                            procedure, the patient's blood pressure, pulse, and                            oxygen saturations were monitored continuously. The                            CF [id number] #[id number] was introduced through the anus                            and advanced to the the  cecum, identified by                            appendiceal orifice and ileocecal valve. The                            colonoscopy was performed without difficulty. The                            patient tolerated the procedure well. The quality                            of the bowel preparation was good. The ileocecal                            valve, appendiceal orifice, and rectum were                            photographed. Scope In: 8:07:23 AM Scope Out: 8:23:59 AM Scope Withdrawal Time: 0 hours 9 minutes 37 seconds  Total Procedure Duration: 0 hours 16 minutes 36 seconds  Findings:                 The perianal and digital rectal examinations were                            normal.                           Multiple diverticula were found in the entire colon.                           Internal hemorrhoids were found.                           The exam was otherwise without abnormality on                            direct and view. (Retroflexion not performed in  rectum due to anatomy) Complications:            No immediate complications. Estimated Blood Loss:     Estimated blood loss: none. Impression:               - Diverticulosis in the entire examined colon.                           - Internal hemorrhoids.                           - The examination was otherwise normal on direct                            and view.                           - No specimens collected. Recommendation:           - Patient has a contact number available for                            emergencies. The signs and symptoms of potential                            delayed complications were discussed with the                            patient. Return to normal activities tomorrow.                            Written discharge instructions were provided to the                            patient.                           - Resume previous diet.                           -  Continue present medications.                           - No repeat screening colonoscopy based on current                            guidelines. Albert Manville L. Loletha Carrow, MD 06/10/2022 8:28:47 AM This report has been signed electronically.

## 2022-06-11 ENCOUNTER — Telehealth: Payer: Self-pay

## 2022-06-11 NOTE — Telephone Encounter (Signed)
  Follow up Call-     06/10/2022    7:09 AM  Call back number  Post procedure Call Back phone  # [phone]  Permission to leave phone message Yes     Patient questions:  Do you have a fever, pain , or abdominal swelling? No. Pain Score  0 *  Have you tolerated food without any problems? Yes.    Have you been able to return to your normal activities? Yes.    Do you have any questions about your discharge instructions: Diet   No. Medications  No. Follow up visit  No.  Do you have questions or concerns about your Care? No.  Actions: * If pain score is 4 or above: No action needed, pain <4.

## 2022-06-16 ENCOUNTER — Other Ambulatory Visit: Payer: Self-pay | Admitting: Urology

## 2022-06-16 DIAGNOSIS — Z125 Encounter for screening for malignant neoplasm of prostate: Secondary | ICD-10-CM

## 2022-06-16 DIAGNOSIS — R972 Elevated prostate specific antigen [PSA]: Secondary | ICD-10-CM

## 2022-06-16 DIAGNOSIS — Z8042 Family history of malignant neoplasm of prostate: Secondary | ICD-10-CM

## 2022-07-15 ENCOUNTER — Ambulatory Visit
Admission: RE | Admit: 2022-07-15 | Discharge: 2022-07-15 | Disposition: A | Discharge status: Home / Self Care | Payer: Medicare Other | Source: Ambulatory Visit | Attending: Urology | Admitting: Urology

## 2022-07-15 DIAGNOSIS — R972 Elevated prostate specific antigen [PSA]: Secondary | ICD-10-CM

## 2022-07-15 DIAGNOSIS — Z125 Encounter for screening for malignant neoplasm of prostate: Secondary | ICD-10-CM

## 2022-07-15 DIAGNOSIS — Z8042 Family history of malignant neoplasm of prostate: Secondary | ICD-10-CM

## 2022-07-15 MED ORDER — GADOPICLENOL 0.5 MMOL/ML IV SOLN
9.0000 mL | Freq: Once | INTRAVENOUS | Status: AC | PRN
  Administered 2022-07-15: 9 mL via INTRAVENOUS

## 2022-08-03 NOTE — Patient Instructions (Signed)
[name] , Thank you for taking time to come for your Medicare Wellness Visit. I appreciate your ongoing commitment to your health goals. Please review the following plan we discussed and let me know if I can assist you in the future.   These are the goals we discussed:  Goals   None     This is a list of the screening recommended for you and due dates:  Health Maintenance  Topic Date Due   DTaP/Tdap/Td vaccine (3 - Td or Tdap) 08/30/2020   COVID-19 Vaccine (6 - 2023-24 season) 04/17/2022   Medicare Annual Wellness Visit  08/02/2022   Cologuard (Stool DNA test)  08/01/2023   Pneumonia Vaccine  Completed   Flu Shot  Completed   Hepatitis C Screening: USPSTF Recommendation to screen - Ages 18-79 yo.  Completed   Zoster (Shingles) Vaccine  Completed   HPV Vaccine  Aged Out   Colon Cancer Screening  Discontinued    Advanced directives: ***  Conditions/risks identified: Aim for 30 minutes of exercise or brisk walking, 6-8 glasses of water, and 5 servings of fruits and vegetables each day.   Next appointment: Follow up in one year for your annual wellness visit.   Preventive Care 73 Years and Older, Male  Preventive care refers to lifestyle choices and visits with your health care provider that can promote health and wellness. What does preventive care include? A yearly physical exam. This is also called an annual well check. Dental exams once or twice a year. Routine eye exams. Ask your health care provider how often you should have your eyes checked. Personal lifestyle choices, including: Daily care of your teeth and gums. Regular physical activity. Eating a healthy diet. Avoiding tobacco and drug use. Limiting alcohol use. Practicing safe sex. Taking low doses of aspirin every day. Taking vitamin and mineral supplements as recommended by your health care provider. What happens during an annual well check? The services and screenings done by your health care provider  during your annual well check will depend on your age, overall health, lifestyle risk factors, and family history of disease. Counseling  Your health care provider may ask you questions about your: Alcohol use. Tobacco use. Drug use. Emotional well-being. Home and relationship well-being. Sexual activity. Eating habits. History of falls. Memory and ability to understand (cognition). Work and work Statistician. Screening  You may have the following tests or measurements: Height, weight, and BMI. Blood pressure. Lipid and cholesterol levels. These may be checked every 5 years, or more frequently if you are over 27 years old. Skin check. Lung cancer screening. You may have this screening every year starting at age 73 if you have a 30-pack-year history of smoking and currently smoke or have quit within the past 15 years. Fecal occult blood test (FOBT) of the stool. You may have this test every year starting at age 73 Flexible sigmoidoscopy or colonoscopy. You may have a sigmoidoscopy every 5 years or a colonoscopy every 10 years starting at age 73 Prostate cancer screening. Recommendations will vary depending on your family history and other risks. Hepatitis C blood test. Hepatitis B blood test. Sexually transmitted disease (STD) testing. Diabetes screening. This is done by checking your blood sugar (glucose) after you have not eaten for a while (fasting). You may have this done every 1-3 years. Abdominal aortic aneurysm (AAA) screening. You may need this if you are a current or former smoker. Osteoporosis. You may be screened starting at age 73 if you  are at high risk. Talk with your health care provider about your test results, treatment options, and if necessary, the need for more tests. Vaccines  Your health care provider may recommend certain vaccines, such as: Influenza vaccine. This is recommended every year. Tetanus, diphtheria, and acellular pertussis (Tdap, Td) vaccine. You  may need a Td booster every 10 years. Zoster vaccine. You may need this after age 73. Pneumococcal 13-valent conjugate (PCV13) vaccine. One dose is recommended after age 73. Pneumococcal polysaccharide (PPSV23) vaccine. One dose is recommended after age 73. Talk to your health care provider about which screenings and vaccines you need and how often you need them. This information is not intended to replace advice given to you by your health care provider. Make sure you discuss any questions you have with your health care provider. Document Released: 06/14/2015 Document Revised: 02/05/2016 Document Reviewed: 03/19/2015 Elsevier Interactive Patient Education  2017 [place] Prevention in the Home Falls can cause injuries. They can happen to people of all ages. There are many things you can do to make your home safe and to help prevent falls. What can I do on the outside of my home? Regularly fix the edges of walkways and driveways and fix any cracks. Remove anything that might make you trip as you walk through a door, such as a raised step or threshold. Trim any bushes or trees on the path to your home. Use bright outdoor lighting. Clear any walking paths of anything that might make someone trip, such as rocks or tools. Regularly check to see if handrails are loose or broken. Make sure that both sides of any steps have handrails. Any raised decks and porches should have guardrails on the edges. Have any leaves, snow, or ice cleared regularly. Use sand or salt on walking paths during winter. Clean up any spills in your garage right away. This includes oil or grease spills. What can I do in the bathroom? Use night lights. Install grab bars by the toilet and in the tub and shower. Do not use towel bars as grab bars. Use non-skid mats or decals in the tub or shower. If you need to sit down in the shower, use a plastic, non-slip stool. Keep the floor dry. Clean up any water that spills  on the floor as soon as it happens. Remove soap buildup in the tub or shower regularly. Attach bath mats securely with double-sided non-slip rug tape. Do not have throw rugs and other things on the floor that can make you trip. What can I do in the bedroom? Use night lights. Make sure that you have a light by your bed that is easy to reach. Do not use any sheets or blankets that are too big for your bed. They should not hang down onto the floor. Have a firm chair that has side arms. You can use this for support while you get dressed. Do not have throw rugs and other things on the floor that can make you trip. What can I do in the kitchen? Clean up any spills right away. Avoid walking on wet floors. Keep items that you use a lot in easy-to-reach places. If you need to reach something above you, use a strong step stool that has a grab bar. Keep electrical cords out of the way. Do not use floor polish or wax that makes floors slippery. If you must use wax, use non-skid floor wax. Do not have throw rugs and other things on the  floor that can make you trip. What can I do with my stairs? Do not leave any items on the stairs. Make sure that there are handrails on both sides of the stairs and use them. Fix handrails that are broken or loose. Make sure that handrails are as long as the stairways. Check any carpeting to make sure that it is firmly attached to the stairs. Fix any carpet that is loose or worn. Avoid having throw rugs at the top or bottom of the stairs. If you do have throw rugs, attach them to the floor with carpet tape. Make sure that you have a light switch at the top of the stairs and the bottom of the stairs. If you do not have them, ask someone to add them for you. What else can I do to help prevent falls? Wear shoes that: Do not have high heels. Have rubber bottoms. Are comfortable and fit you well. Are closed at the toe. Do not wear sandals. If you use a stepladder: Make  sure that it is fully opened. Do not climb a closed stepladder. Make sure that both sides of the stepladder are locked into place. Ask someone to hold it for you, if possible. Clearly mark and make sure that you can see: Any grab bars or handrails. First and last steps. Where the edge of each step is. Use tools that help you move around (mobility aids) if they are needed. These include: Canes. Walkers. Scooters. Crutches. Turn on the lights when you go into a dark area. Replace any light bulbs as soon as they burn out. Set up your furniture so you have a clear path. Avoid moving your furniture around. If any of your floors are uneven, fix them. If there are any pets around you, be aware of where they are. Review your medicines with your doctor. Some medicines can make you feel dizzy. This can increase your chance of falling. Ask your doctor what other things that you can do to help prevent falls. This information is not intended to replace advice given to you by your health care provider. Make sure you discuss any questions you have with your health care provider. Document Released: 03/14/2009 Document Revised: 10/24/2015 Document Reviewed: 06/22/2014 Elsevier Interactive Patient Education  2017 [employer].

## 2022-08-03 NOTE — Progress Notes (Unsigned)
Subjective:   Albert Walls is a 72 y.o. male who presents for Medicare Annual/Subsequent preventive examination.  Review of Systems    ***       Objective:    There were no vitals filed for this visit. There is no height or weight on file to calculate BMI.      No data to display          Current Medications (verified) Outpatient Encounter Medications as of 08/04/2022  Medication Sig   amLODipine (NORVASC) 5 MG tablet Take 1 tablet (5 mg total) by mouth daily.   metoprolol succinate (TOPROL-XL) 50 MG 24 hr tablet Take 1 tablet (50 mg total) by mouth daily.   Multiple Vitamins-Minerals (EMERGEN-C VITAMIN C PO) Take 1 packet by mouth daily as needed (as needed).   No facility-administered encounter medications on file as of 08/04/2022.    Allergies (verified) Patient has no known allergies.   History: Past Medical History:  Diagnosis Date   Community acquired pneumonia 06/02/1975   lingula   GERD (gastroesophageal reflux disease)    hx of- diet controlled   HTN (hypertension)    on meds   Hyperlipidemia    diet controlled   Past Surgical History:  Procedure Laterality Date   COLONOSCOPY  2013   Dr. Kaplan-suprep(exc)normal -78 yr recall   TONSILLECTOMY AND ADENOIDECTOMY  1956   UPPER GASTROINTESTINAL ENDOSCOPY     gastritis   VASECTOMY  06/01/1985   Family History  Problem Relation Age of Onset   Stroke Paternal Aunt 61   Testicular cancer Paternal Uncle    Diabetes Unknown        paternal great uncle   Colon cancer Other        PGGF   Colon cancer Other        Paternal great uncle    Colon cancer Maternal Grandfather    Dementia Mother    Heart disease Neg Hx    Social History   Socioeconomic History   Marital status: Married    Spouse name: Not on file   Number of children: 4   Years of education: Not on file   Highest education level: Not on file  Occupational History   Occupation: Chief Financial Officer  Tobacco Use   Smoking status: Never    Smokeless tobacco: Never  Vaping Use   Vaping Use: Never used  Substance and Sexual Activity   Alcohol use: Yes    Alcohol/week: 7.0 - 10.0 standard drinks of alcohol    Types: 7 - 10 Standard drinks or equivalent per week    Comment:     Drug use: No   Sexual activity: Not on file  Other Topics Concern   Not on file  Social History Narrative   Not on file   Social Determinants of Health   Financial Resource Strain: Not on file  Food Insecurity: Not on file  Transportation Needs: Not on file  Physical Activity: Not on file  Stress: Not on file  Social Connections: Not on file    Tobacco Counseling Counseling given: Not Answered   Clinical Intake:              How often do you need to have someone help you when you read instructions, pamphlets, or other written materials from your doctor or pharmacy?: (P) 1 - Never  Diabetic?No          Activities of Daily Living    07/31/2022    4:57 PM  In your present state of health, do you have any difficulty performing the following activities:  Hearing? 0  Vision? 0  Difficulty concentrating or making decisions? 0  Walking or climbing stairs? 0  Dressing or bathing? 0  Doing errands, shopping? 0  Preparing Food and eating ? N  Using the Toilet? N  In the past six months, have you accidently leaked urine? N  Do you have problems with loss of bowel control? N  Managing your Medications? N  Managing your Finances? N  Housekeeping or managing your Housekeeping? N    Patient Care Team: Hoyt Koch, MD as PCP - General (Internal Medicine)  Indicate any recent Medical Services you may have received from other than [facility] providers in the past year (date may be approximate).     Assessment:   This is a routine wellness examination for Jerron.  Hearing/Vision screen No results found.  Dietary issues and exercise activities discussed:     Goals Addressed   None    Depression Screen    08/01/2021     8:05 AM 06/24/2020    1:59 PM 03/01/2019    1:31 PM 03/02/2017   10:36 AM 08/12/2015   10:49 AM  PHQ 2/9 Scores  PHQ - 2 Score 0 0 0 0 0    Fall Risk    07/31/2022    4:57 PM 08/01/2021    8:05 AM 06/24/2020    1:59 PM 03/01/2019    1:31 PM 04/19/2018    4:48 PM  [place] in the past year? 0 0 0 0 0  Comment     Emmi Telephone Survey: data to providers prior to load  Number falls in past yr: 0 0 0    Injury with Fall? 0 0 0      FALL RISK PREVENTION PERTAINING TO THE HOME:  Any stairs in or around the home? {YES/NO:21197} If so, are there any without handrails? {YES/NO:21197} Home free of loose throw rugs in walkways, pet beds, electrical cords, etc? {YES/NO:21197} Adequate lighting in your home to reduce risk of falls? {YES/NO:21197}  ASSISTIVE DEVICES UTILIZED TO PREVENT FALLS:  Life alert? {YES/NO:21197} Use of a cane, walker or w/c? {YES/NO:21197} Grab bars in the bathroom? {YES/NO:21197} Shower chair or bench in shower? {YES/NO:21197} Elevated toilet seat or a handicapped toilet? {YES/NO:21197}  TIMED UP AND GO:  Was the test performed? No . Telephonic visit   Cognitive Function:        Immunizations Immunization History  Administered Date(s) Administered   Fluad Quad(high Dose 65+) 02/01/2019, 01/29/2020, 02/13/2022   Hepatitis A 03/19/1998, 02/21/1999   Hepatitis B 03/19/1998, 06/21/1998, 02/21/1999   Influenza, High Dose Seasonal PF 03/02/2017   Influenza,inj,Quad PF,6+ Mos 04/04/2016   Influenza-Unspecified 02/22/2013, 03/16/2015, 04/04/2018   PFIZER(Purple Top)SARS-COV-2 Vaccination 07/07/2019, 08/01/2019, 02/05/2020   Pfizer Covid-19 Vaccine Bivalent Booster 31yr & up 02/03/2021, 02/20/2022   Pneumococcal Conjugate-13 08/13/2016   Pneumococcal Polysaccharide-23 09/13/2017   Rsv, Mab, NKathie Rhodes 0.5 Ml, Neonate To 24 Mos(Beyfortus) 02/13/2022   Td 09/28/2008   Tdap 08/31/2010   Zoster Recombinat (Shingrix) 09/21/2017, 02/16/2018     {TDAP status:2101805}  Flu Vaccine status: Up to date  Pneumococcal vaccine status: Up to date  Covid-19 vaccine status: Information provided on how to obtain vaccines.   Qualifies for Shingles Vaccine? Yes   Zostavax completed No   Shingrix Completed?: Yes  Screening Tests Health Maintenance  Topic Date Due   DTaP/Tdap/Td (3 - Td or Tdap) 08/30/2020  COVID-19 Vaccine (6 - 2023-24 season) 04/17/2022   Medicare Annual Wellness (AWV)  08/02/2022   Fecal DNA (Cologuard)  08/01/2023   Pneumonia Vaccine 52+ Years old  Completed   INFLUENZA VACCINE  Completed   Hepatitis C Screening  Completed   Zoster Vaccines- Shingrix  Completed   HPV VACCINES  Aged Out   COLONOSCOPY (Pts 45-7yr Insurance coverage will need to be confirmed)  DPonderaMaintenance Due  Topic Date Due   DTaP/Tdap/Td (3 - Td or Tdap) 08/30/2020   COVID-19 Vaccine (6 - 2023-24 season) 04/17/2022   Medicare Annual Wellness (AWV)  08/02/2022    Colorectal cancer screening: Type of screening: Colonoscopy. Completed 06/10/22. Repeat every *** years  Lung Cancer Screening: (Low Dose CT Chest recommended if Age 334-80years, 30 pack-year currently smoking OR have quit w/in 15years.) does not qualify.   Lung Cancer Screening Referral: n/a  Additional Screening:  Hepatitis C Screening: does qualify; Completed 08/16/15  Vision Screening: Recommended annual ophthalmology exams for early detection of glaucoma and other disorders of the eye. Is the patient up to date with their annual eye exam?  No  Who is the provider or what is the name of the office in which the patient attends annual eye exams? Dr. Heather [place] pt is not established with a provider, would they like to be referred to a provider to establish care? No .   Dental Screening: Recommended annual dental exams for proper oral hygiene  Community Resource Referral / Chronic Care Management: CRR required this  visit?  {YES/NO:21197}  CCM required this visit?  {YES/NO:21197}     Plan:     I have personally reviewed and noted the following in the patient's chart:   Medical and social history Use of alcohol, tobacco or illicit drugs  Current medications and supplements including opioid prescriptions. {Opioid Prescriptions:[phone]} Functional ability and status Nutritional status Physical activity Advanced directives List of other physicians Hospitalizations, surgeries, and ER visits in previous 12 months Vitals Screenings to include cognitive, depression, and falls Referrals and appointments  In addition, I have reviewed and discussed with patient certain preventive protocols, quality metrics, and best practice recommendations. A written personalized care plan for preventive services as well as general preventive health recommendations were provided to patient.     SVanetta Mulders [place]  [id number]  Due to this being a virtual visit, the after visit summary with patients personalized plan was offered to patient via mail or my-chart. ***Patient declined at this time./ Patient would like to access on my-chart/ per request, patient was mailed a copy of AVS./ Patient preferred to pick up at office at next visit   Nurse Notes: ***

## 2022-08-04 ENCOUNTER — Ambulatory Visit (INDEPENDENT_AMBULATORY_CARE_PROVIDER_SITE_OTHER): Payer: Medicare Other

## 2022-08-04 VITALS — Ht 67.0 in | Wt 197.0 lb

## 2022-08-04 DIAGNOSIS — Z0000 Encounter for general adult medical examination without abnormal findings: Secondary | ICD-10-CM | POA: Diagnosis not present

## 2022-08-04 NOTE — Progress Notes (Signed)
I connected with  Albert Walls on 08/04/22 by a audio enabled telemedicine application and verified that I am speaking with the correct person using two identifiers.  Patient Location: Home  Provider Location: Home Office  I discussed the limitations of evaluation and management by telemedicine. The patient expressed understanding and agreed to proceed.

## 2022-08-14 ENCOUNTER — Encounter: Payer: Medicare Other | Admitting: Internal Medicine

## 2022-08-17 ENCOUNTER — Ambulatory Visit (INDEPENDENT_AMBULATORY_CARE_PROVIDER_SITE_OTHER): Payer: Medicare Other | Admitting: Internal Medicine

## 2022-08-17 ENCOUNTER — Encounter: Payer: Self-pay | Admitting: Internal Medicine

## 2022-08-17 VITALS — BP 138/100 | HR 54 | Temp 98.1°F | Ht 67.0 in | Wt 204.1 lb

## 2022-08-17 DIAGNOSIS — I10 Essential (primary) hypertension: Secondary | ICD-10-CM | POA: Diagnosis not present

## 2022-08-17 DIAGNOSIS — Z0000 Encounter for general adult medical examination without abnormal findings: Secondary | ICD-10-CM | POA: Diagnosis not present

## 2022-08-17 DIAGNOSIS — R972 Elevated prostate specific antigen [PSA]: Secondary | ICD-10-CM

## 2022-08-17 LAB — COMPREHENSIVE METABOLIC PANEL
ALT: 18 U/L (ref 0–53)
AST: 20 U/L (ref 0–37)
Albumin: 4.1 g/dL (ref 3.5–5.2)
Alkaline Phosphatase: 55 U/L (ref 39–117)
BUN: 22 mg/dL (ref 6–23)
CO2: 31 mEq/L (ref 19–32)
Calcium: 9.5 mg/dL (ref 8.4–10.5)
Chloride: 100 mEq/L (ref 96–112)
Creatinine, Ser: 1.01 mg/dL (ref 0.40–1.50)
GFR: 74.38 mL/min (ref >60.00)
Glucose, Bld: 100 mg/dL — ABNORMAL HIGH (ref 70–99)
Potassium: 3.8 mEq/L (ref 3.5–5.1)
Sodium: 140 mEq/L (ref 135–145)
Total Bilirubin: 0.9 mg/dL (ref 0.2–1.2)
Total Protein: 6.9 g/dL (ref 6.0–8.3)

## 2022-08-17 LAB — LIPID PANEL
Cholesterol: 219 mg/dL — ABNORMAL HIGH (ref 0–200)
HDL: 51.2 mg/dL (ref >39.00)
NonHDL: 168.2
Total CHOL/HDL Ratio: 4
Triglycerides: 201 mg/dL — ABNORMAL HIGH (ref 0.0–149.0)
VLDL: 40.2 mg/dL — ABNORMAL HIGH (ref 0.0–40.0)

## 2022-08-17 LAB — CBC
HCT: 42.6 % (ref 39.0–52.0)
Hemoglobin: 14.6 g/dL (ref 13.0–17.0)
MCHC: 34.2 g/dL (ref 30.0–36.0)
MCV: 89.6 fl (ref 78.0–100.0)
Platelets: 278 10*3/uL (ref 150.0–400.0)
RBC: 4.76 Mil/uL (ref 4.22–5.81)
RDW: 13.4 % (ref 11.5–15.5)
WBC: 7.7 10*3/uL (ref 4.0–10.5)

## 2022-08-17 LAB — HEMOGLOBIN A1C: Hgb A1c MFr Bld: 5.9 % (ref 4.6–6.5)

## 2022-08-17 LAB — LDL CHOLESTEROL, DIRECT: Direct LDL: 135 mg/dL

## 2022-08-17 NOTE — Progress Notes (Signed)
   Subjective:   Patient ID: Albert Walls, male    DOB: 05-30-1951, 72 y.o.   MRN: [id number]  HPI The patient is here for physical.  PMH, [facility], social history reviewed and updated  Review of Systems  Constitutional: Negative.   HENT: Negative.    Eyes: Negative.   Respiratory:  Negative for cough, chest tightness and shortness of breath.   Cardiovascular:  Negative for chest pain, palpitations and leg swelling.  Gastrointestinal:  Negative for abdominal distention, abdominal pain, constipation, diarrhea, nausea and vomiting.  Musculoskeletal: Negative.   Skin: Negative.   Neurological: Negative.   Psychiatric/Behavioral: Negative.      Objective:  Physical Exam Constitutional:      Appearance: He is well-developed.  HENT:     Head: Normocephalic and atraumatic.  Cardiovascular:     Rate and Rhythm: Normal rate and regular rhythm.  Pulmonary:     Effort: Pulmonary effort is normal. No respiratory distress.     Breath sounds: Normal breath sounds. No wheezing or rales.  Abdominal:     General: Bowel sounds are normal. There is no distension.     Palpations: Abdomen is soft.     Tenderness: There is no abdominal tenderness. There is no rebound.  Musculoskeletal:     Cervical back: Normal range of motion.  Skin:    General: Skin is warm and dry.  Neurological:     Mental Status: He is alert and oriented to person, place, and time.     Coordination: Coordination normal.     Vitals:   08/17/22 0825 08/17/22 0829  BP: (!) 142/100 (!) 138/100  Pulse: (!) 54   Temp: 98.1 F (36.7 C)   TempSrc: Oral   SpO2: 96%   Weight: 204 lb 2 oz (92.6 kg)   Height: 5\' 7"  (1.702 m)     Assessment & Plan:

## 2022-08-17 NOTE — Assessment & Plan Note (Signed)
Has seen urology and MR prostate with upcoming biopsy.

## 2022-08-17 NOTE — Assessment & Plan Note (Signed)
BP normal at home with reading 138 72 from home nurse insurance visit. Continue amlodipine 5 mg daily and metoprolol 50 mg daily. He did not take meds today. Checking CMP and lipid panel and CBC. Adjust as needed.

## 2022-08-17 NOTE — Assessment & Plan Note (Signed)
Flu shot yearly. Covid-19 up to date. Pneumonia complete. Shingrix complete. Tetanus up to date. Colonoscopy up to date. Counseled about sun safety and mole surveillance. Counseled about the dangers of distracted driving. Given 10 year screening recommendations.

## 2022-08-19 ENCOUNTER — Encounter: Payer: Self-pay | Admitting: Internal Medicine

## 2022-08-19 DIAGNOSIS — I10 Essential (primary) hypertension: Secondary | ICD-10-CM

## 2022-09-11 ENCOUNTER — Encounter: Payer: Self-pay | Admitting: Internal Medicine

## 2022-09-11 NOTE — Telephone Encounter (Signed)
Pt has no questions as of right now for you, but possibly soon will make an appointment to jut come in and just explain his plans with you about this

## 2022-09-16 ENCOUNTER — Encounter: Payer: Self-pay | Admitting: Internal Medicine

## 2022-09-16 ENCOUNTER — Ambulatory Visit (INDEPENDENT_AMBULATORY_CARE_PROVIDER_SITE_OTHER): Payer: Medicare Other | Admitting: Internal Medicine

## 2022-09-16 VITALS — BP 160/100 | HR 68 | Temp 98.7°F | Ht 67.0 in | Wt 202.2 lb

## 2022-09-16 DIAGNOSIS — C61 Malignant neoplasm of prostate: Secondary | ICD-10-CM | POA: Diagnosis not present

## 2022-09-16 NOTE — Progress Notes (Unsigned)
   Subjective:   Patient ID: Albert Walls, male    DOB: 1950-09-05, 72 y.o.   MRN: [id number]  HPI The patient is a 72 YO man coming in for counseling about recent prostate cancer diagnosis. [place] 3+4   Review of Systems  Constitutional: Negative.   HENT: Negative.    Eyes: Negative.   Respiratory:  Negative for cough, chest tightness and shortness of breath.   Cardiovascular:  Negative for chest pain, palpitations and leg swelling.  Gastrointestinal:  Negative for abdominal distention, abdominal pain, constipation, diarrhea, nausea and vomiting.  Musculoskeletal: Negative.   Skin: Negative.   Neurological: Negative.   Psychiatric/Behavioral: Negative.      Objective:  Physical Exam Constitutional:      Appearance: He is well-developed.  HENT:     Head: Normocephalic and atraumatic.  Cardiovascular:     Rate and Rhythm: Normal rate and regular rhythm.  Pulmonary:     Effort: Pulmonary effort is normal. No respiratory distress.     Breath sounds: Normal breath sounds. No wheezing or rales.  Abdominal:     General: Bowel sounds are normal. There is no distension.     Palpations: Abdomen is soft.     Tenderness: There is no abdominal tenderness. There is no rebound.  Musculoskeletal:     Cervical back: Normal range of motion.  Skin:    General: Skin is warm and dry.  Neurological:     Mental Status: He is alert and oriented to person, place, and time.     Coordination: Coordination normal.     Vitals:   09/16/22 0914  BP: (!) 160/100  Pulse: 68  Temp: 98.7 F (37.1 C)  TempSrc: Oral  SpO2: 96%  Weight: 202 lb 4 oz (91.7 kg)  Height:  (1.702 m)    Assessment & Plan:  Visit time 15 minutes in face to face communication with patient and coordination of care, additional 5 minutes spent in record review, coordination or care, ordering tests, communicating/referring to other healthcare professionals, documenting in medical records all on the same day of the  visit for total time 20 minutes spent on the visit.

## 2022-09-17 NOTE — Assessment & Plan Note (Signed)
He will likely get prostatectomy and plans to talk to 2 surgeons prior to making decision. Counseled about workup and options.

## 2022-10-02 ENCOUNTER — Other Ambulatory Visit: Payer: Self-pay | Admitting: Urology

## 2022-10-07 ENCOUNTER — Other Ambulatory Visit (HOSPITAL_COMMUNITY): Payer: Self-pay | Admitting: Urology

## 2022-10-07 DIAGNOSIS — M6281 Muscle weakness (generalized): Secondary | ICD-10-CM | POA: Diagnosis not present

## 2022-10-07 DIAGNOSIS — C61 Malignant neoplasm of prostate: Secondary | ICD-10-CM

## 2022-10-12 ENCOUNTER — Other Ambulatory Visit: Payer: Self-pay | Admitting: Internal Medicine

## 2022-10-12 DIAGNOSIS — I10 Essential (primary) hypertension: Secondary | ICD-10-CM

## 2022-10-15 ENCOUNTER — Telehealth: Payer: Self-pay | Admitting: Internal Medicine

## 2022-10-15 ENCOUNTER — Other Ambulatory Visit: Payer: Self-pay

## 2022-10-15 DIAGNOSIS — I10 Essential (primary) hypertension: Secondary | ICD-10-CM

## 2022-10-15 MED ORDER — METOPROLOL SUCCINATE ER 50 MG PO TB24
50.0000 mg | ORAL_TABLET | Freq: Every day | ORAL | 3 refills | Status: DC
Start: 2022-10-15 — End: 2023-09-07

## 2022-10-15 NOTE — Telephone Encounter (Signed)
Prescription Request  10/15/2022  LOV: 09/16/2022  What is the name of the medication or equipment? Metoprolol 50 mg.  Have you contacted your pharmacy to request a refill? Yes   Which pharmacy would you like this sent to?  [facility] [place], [place] - [address] [facility] [address] [place] [address] Phone: [phone] Fax: [phone]   Patient notified that their request is being sent to the clinical staff for review and that they should receive a response within 2 business days.   Please advise at Mobile [phone] (mobile)

## 2022-10-19 DIAGNOSIS — Z01818 Encounter for other preprocedural examination: Secondary | ICD-10-CM | POA: Diagnosis not present

## 2022-10-28 ENCOUNTER — Encounter (HOSPITAL_COMMUNITY): Payer: Self-pay

## 2022-11-04 DIAGNOSIS — L578 Other skin changes due to chronic exposure to nonionizing radiation: Secondary | ICD-10-CM | POA: Diagnosis not present

## 2022-11-04 DIAGNOSIS — D2261 Melanocytic nevi of right upper limb, including shoulder: Secondary | ICD-10-CM | POA: Diagnosis not present

## 2022-11-04 DIAGNOSIS — L821 Other seborrheic keratosis: Secondary | ICD-10-CM | POA: Diagnosis not present

## 2022-11-04 DIAGNOSIS — D1801 Hemangioma of skin and subcutaneous tissue: Secondary | ICD-10-CM | POA: Diagnosis not present

## 2022-11-04 DIAGNOSIS — H40013 Open angle with borderline findings, low risk, bilateral: Secondary | ICD-10-CM | POA: Diagnosis not present

## 2022-11-04 DIAGNOSIS — L719 Rosacea, unspecified: Secondary | ICD-10-CM | POA: Diagnosis not present

## 2022-11-04 DIAGNOSIS — D485 Neoplasm of uncertain behavior of skin: Secondary | ICD-10-CM | POA: Diagnosis not present

## 2022-11-04 DIAGNOSIS — L814 Other melanin hyperpigmentation: Secondary | ICD-10-CM | POA: Diagnosis not present

## 2022-11-04 NOTE — Patient Instructions (Signed)
SURGICAL WAITING ROOM VISITATION  Patients having surgery or a procedure may have no more than 2 support people in the waiting area - these visitors may rotate.    Children under the age of 21 must have an adult with them who is not the patient.  Due to an increase in RSV and influenza rates and associated hospitalizations, children ages 35 and under may not visit patients in [facility] hospitals.  If the patient needs to stay at the hospital during part of their recovery, the visitor guidelines for inpatient rooms apply. Pre-op nurse will coordinate an appropriate time for 1 support person to accompany patient in pre-op.  This support person may not rotate.    Please refer to the [facility] website for the visitor guidelines for Inpatients (after your surgery is over and you are in a regular room).    Your procedure is scheduled on: 6:10/24   Report to [facility] Main Entrance    Report to admitting at 5:15 AM   Call this number if you have problems the morning of surgery [phone]   Follow a clear liquid diet the day before surgery.  Water Non-Citrus Juices (without pulp, NO RED-Apple, White grape, White cranberry) Black Coffee (NO MILK/CREAM OR CREAMERS, sugar ok)  Clear Tea (NO MILK/CREAM OR CREAMERS, sugar ok) regular and decaf                             Plain Jell-O (NO RED)                                           Fruit ices (not with fruit pulp, NO RED)                                     Popsicles (NO RED)                                                               Sports drinks like Gatorade (NO RED)              Nothing to drink after midnight          If you have questions, please contact your surgeon's office.   FOLLOW BOWEL PREP AND ANY ADDITIONAL PRE OP INSTRUCTIONS YOU RECEIVED FROM YOUR SURGEON'S OFFICE!!!     Oral Hygiene is also important to reduce your risk of infection.                                    Remember - BRUSH YOUR TEETH THE  MORNING OF SURGERY WITH YOUR REGULAR TOOTHPASTE  DENTURES WILL BE REMOVED PRIOR TO SURGERY PLEASE DO NOT APPLY "Poly grip" OR ADHESIVES!!!   Do NOT smoke after Midnight   Take these medicines the morning of surgery with A SIP OF WATER: Amlodipine, Metoprolol   DO NOT TAKE ANY ORAL DIABETIC MEDICATIONS DAY OF YOUR SURGERY  Bring CPAP mask and tubing day of surgery.  You may not have any metal on your body including jewelry, and body piercing             Do not wear lotions, powders, cologne, or deodorant              Men may shave face and neck.   Do not bring valuables to the hospital. [facility] IS NOT             RESPONSIBLE   FOR VALUABLES.   Contacts, glasses, dentures or bridgework may not be worn into surgery.   Bring small overnight bag day of surgery.   DO NOT BRING YOUR HOME MEDICATIONS TO THE HOSPITAL. PHARMACY WILL DISPENSE MEDICATIONS LISTED ON YOUR MEDICATION LIST TO YOU DURING YOUR ADMISSION IN THE HOSPITAL!   Special Instructions: Bring a copy of your healthcare power of attorney and living will documents the day of surgery if you haven't scanned them before.              Please read over the following fact sheets you were given: IF YOU HAVE QUESTIONS ABOUT YOUR PRE-OP INSTRUCTIONS PLEASE CALL [phone] [place]    If you received a COVID test during your pre-op visit  it is requested that you wear a mask when out in public, stay away from anyone that may not be feeling well and notify your surgeon if you develop symptoms. If you test positive for Covid or have been in contact with anyone that has tested positive in the last 10 days please notify you surgeon.    [place] [facility] - Preparing for Surgery Before surgery, you can play an important role.  Because skin is not sterile, your skin needs to be as free of germs as possible.  You can reduce the number of germs on your skin by washing with CHG (chlorahexidine gluconate) soap before  surgery.  CHG is an antiseptic cleaner which kills germs and bonds with the skin to continue killing germs even after washing. Please DO NOT use if you have an allergy to CHG or antibacterial soaps.  If your skin becomes reddened/irritated stop using the CHG and inform your nurse when you arrive at Short Stay. Do not shave (including legs and underarms) for at least 48 hours prior to the first CHG shower.  You may shave your face/neck.  Please follow these instructions carefully:  1.  Shower with CHG Soap the night before surgery and the  morning of surgery.  2.  If you choose to wash your hair, wash your hair first as usual with your normal  shampoo.  3.  After you shampoo, rinse your hair and body thoroughly to remove the shampoo.                             4.  Use CHG as you would any other liquid soap.  You can apply chg directly to the skin and wash.  Gently with a scrungie or clean washcloth.  5.  Apply the CHG Soap to your body ONLY FROM THE NECK DOWN.   Do   not use on face/ open                           Wound or open sores. Avoid contact with eyes, ears mouth and   genitals (private parts).  Wash face,  Genitals (private parts) with your normal soap.             6.  Wash thoroughly, paying special attention to the area where your    surgery  will be performed.  7.  Thoroughly rinse your body with warm water from the neck down.  8.  DO NOT shower/wash with your normal soap after using and rinsing off the CHG Soap.                9.  Pat yourself dry with a clean towel.            10.  Wear clean pajamas.            11.  Place clean sheets on your bed the night of your first shower and do not  sleep with pets. Day of Surgery : Do not apply any lotions/deodorants the morning of surgery.  Please wear clean clothes to the hospital/surgery center.  FAILURE TO FOLLOW THESE INSTRUCTIONS MAY RESULT IN THE CANCELLATION OF YOUR SURGERY  PATIENT  SIGNATURE_________________________________  NURSE SIGNATURE__________________________________  ________________________________________________________________________

## 2022-11-04 NOTE — Progress Notes (Signed)
COVID Vaccine Completed: yes  Date of COVID positive in last 90 days:  PCP - Hillard Danker, MD Cardiologist -   Chest x-ray -  EKG -  Stress Test -  ECHO -  Cardiac Cath -  Pacemaker/ICD device last checked: Spinal Cord Stimulator:  Bowel Prep -   Sleep Study -  CPAP -   Fasting Blood Sugar -  Checks Blood Sugar _____ times a day  Last dose of GLP1 agonist-  N/A GLP1 instructions:  N/A   Last dose of SGLT-2 inhibitors-  N/A SGLT-2 instructions: N/A   Blood Thinner Instructions:  Time Aspirin Instructions: Last Dose:  Activity level:  Can go up a flight of stairs and perform activities of daily living without stopping and without symptoms of chest pain or shortness of breath.  Able to exercise without symptoms  Unable to go up a flight of stairs without symptoms of     Anesthesia review:   Patient denies shortness of breath, fever, cough and chest pain at PAT appointment  Patient verbalized understanding of instructions that were given to them at the PAT appointment. Patient was also instructed that they will need to review over the PAT instructions again at home before surgery.

## 2022-11-05 ENCOUNTER — Ambulatory Visit (HOSPITAL_COMMUNITY)
Admission: RE | Admit: 2022-11-05 | Discharge: 2022-11-05 | Disposition: A | Discharge status: Home / Self Care | Payer: Medicare Other | Source: Ambulatory Visit | Attending: Urology | Admitting: Urology

## 2022-11-05 ENCOUNTER — Encounter (HOSPITAL_COMMUNITY)
Admission: RE | Admit: 2022-11-05 | Discharge: 2022-11-05 | Disposition: A | Discharge status: Home / Self Care | Payer: Medicare Other | Source: Ambulatory Visit | Attending: Urology | Admitting: Urology

## 2022-11-05 ENCOUNTER — Other Ambulatory Visit: Payer: Self-pay

## 2022-11-05 ENCOUNTER — Encounter (HOSPITAL_COMMUNITY): Payer: Self-pay

## 2022-11-05 VITALS — BP 155/93 | HR 58 | Temp 98.0°F | Resp 16 | Ht 67.0 in | Wt 200.0 lb

## 2022-11-05 DIAGNOSIS — C61 Malignant neoplasm of prostate: Secondary | ICD-10-CM | POA: Insufficient documentation

## 2022-11-05 DIAGNOSIS — I10 Essential (primary) hypertension: Secondary | ICD-10-CM

## 2022-11-05 HISTORY — DX: Malignant (primary) neoplasm, unspecified: C80.1

## 2022-11-05 LAB — BASIC METABOLIC PANEL
Anion gap: 7 (ref 5–15)
BUN: 19 mg/dL (ref 8–23)
CO2: 27 mmol/L (ref 22–32)
Calcium: 8.8 mg/dL — ABNORMAL LOW (ref 8.9–10.3)
Chloride: 102 mmol/L (ref 98–111)
Creatinine, Ser: 0.79 mg/dL (ref 0.61–1.24)
GFR, Estimated: >60 mL/min (ref >60)
Glucose, Bld: 96 mg/dL (ref 70–99)
Potassium: 3.9 mmol/L (ref 3.5–5.1)
Sodium: 136 mmol/L (ref 135–145)

## 2022-11-05 LAB — CBC
HCT: 41.3 % (ref 39.0–52.0)
Hemoglobin: 13.6 g/dL (ref 13.0–17.0)
MCH: 30 pg (ref 26.0–34.0)
MCHC: 32.9 g/dL (ref 30.0–36.0)
MCV: 91.2 fL (ref 80.0–100.0)
Platelets: 265 10*3/uL (ref 150–400)
RBC: 4.53 MIL/uL (ref 4.22–5.81)
RDW: 12.9 % (ref 11.5–15.5)
WBC: 6.9 10*3/uL (ref 4.0–10.5)
nRBC: 0 % (ref 0.0–0.2)

## 2022-11-05 MED ORDER — PIFLIFOLASTAT F 18 (PYLARIFY) INJECTION
9.0000 | Freq: Once | INTRAVENOUS | Status: AC
  Administered 2022-11-05: 8.72 via INTRAVENOUS

## 2022-11-06 LAB — TYPE AND SCREEN: ABO/RH(D): A POS

## 2022-11-06 NOTE — H&P (Signed)
Office Visit Report     10/19/2022   --------------------------------------------------------------------------------   Albert Walls  MRN: [id number]  DOB: Jul 03, 1950, 72 year old Male  SSN:    PRIMARY CARE:  Hillard Danker  PRIMARY CARE FAX:  [phone]  REFERRING:  Azucena Kuba  PROVIDER:  Rutherford Nail, M.D.  TREATING:  Anne Fu, NP  LOCATION:  [facility] [phone]     --------------------------------------------------------------------------------   CC/HPI: CC: Prostate Cancer   Physician requesting consult: Dr. Verdia Kuba  PCP: Dr. Hillard Danker   [name] is a 72 year old gentleman who was noted to have an elevated PSA of 4.78 prompting an MRI of the prostate on 07/15/22. This demonstrated an 8 mm PI-RADS 4 lesions at the right mid peripheral zone. An MR/US fusion TRUS biopsy by Dr. Alvester Morin on 08/26/22 confirmed Gleason 4+3=7 adenocarcinoma of the prostate with 3 out of 4 targeted biopsies positive and 4 out of 12 systematic biopsies positive all on the right side.   Family history: His paternal uncle died of metastatic prostate cancer in his late 81s.   Imaging studies: MRI (08/26/22) - No EPE, SVI, LAD, or bone lesions.   PMH: He has a history of GERD, hypertension, and hyperlipidemia.  PSH: No abdominal surgeries.   TNM stage: cT1c N0 Mx  PSA: 4.78  Gleason score: 4+3=7 (GG 3)  Biopsy (08/26/22): 7/16 cores positive  Left: Benign  Right: R mid (50%, 4+3=7), R lateral mid (60%, 4+3=7), R base (40%, 4+3=7, PNI), R lateral base (65%, 4+3=7)  MR target: 3/4 cores positive (4+3=7, 42% of tissue, 90% pattern 4)  Prostate volume: 27.8 cc   Nomogram  OC disease: 42%  EPE: 56%  SVI: 9%  LNI: 11%  PFS (5 year, 10 year): 65%, 49%   Urinary function: IPSS is 2.  Erectile function: SHIM score is 12. He is typically unable to get adequate erections for intercourse. This has been a low priority and they have not been  sexually active for the past 10 years. He has not tried PDE 5 inhibitors in the past.   10/19/2022: Pt here for pre-operative visit prior to undergoing robot-assisted laparoscopic radical prostatectomy and bilateral pelvic lymphadenectomy as well as umbilical hernia repair with Dr Laverle Patter on 11/09/2022. PSMA PET scan is scheduled to be performed on 06/06 to complete his metastatic evaluation. Pt has already met with pelvic floor PT in anticipation of the procedure. Overall doing well, denies any changes in past medical history, prescription medications taken on a daily basis, no interval surgical or procedural intervention. He continues to void in his baseline with stable, grossly nonbothersome symptomology. He has had no interval dysuria, gross hematuria or interval treatment for UTI. He denies any recent chest pain, shortness of breath, nausea/vomiting or fever/chills.     ALLERGIES: No Allergies    MEDICATIONS: Metoprolol Succinate  Amlodipine Besylate 5 mg tablet  Vitamin C     GU PSH: Prostate Needle Biopsy - 08/26/2022 Vasectomy     NON-GU PSH: Surgical Pathology, Gross And Microscopic Examination For Prostate Needle - 08/26/2022 Visit Complexity (formerly GPC1X) - 09/10/2022     GU PMH: Nocturia - 10/07/2022 Prostate Cancer - 09/29/2022, - 09/10/2022 Elevated PSA - 08/26/2022, (Stable), - 07/20/2022, - 06/11/2022, - 12/16/2021 Prostate, Neoplasm of uncertain behavior - 08/26/2022, - 07/20/2022 Encounter for Prostate Cancer screening - 06/11/2022, - 12/16/2021 Family Hx of Prostate Cancer - 06/11/2022, - 12/16/2021 BPH w/o LUTS - 12/16/2021  NON-GU PMH: Muscle weakness (generalized) - 10/07/2022 GERD Hypercholesterolemia Hypertension    FAMILY HISTORY: 1 son - Other 3 daughters - Other Colon Cancer - Runs in Family Prostate Cancer - Uncle   SOCIAL HISTORY: Marital Status: Married Preferred Language: English; Race: White Current Smoking Status: Patient does not smoke anymore.    Tobacco Use Assessment Completed: Used Tobacco in last 30 days? Drinks 2 caffeinated drinks per day.    REVIEW OF SYSTEMS:    GU Review Male:   Patient denies frequent urination, hard to postpone urination, burning/ pain with urination, get up at night to urinate, leakage of urine, stream starts and stops, trouble starting your stream, have to strain to urinate , erection problems, and penile pain.  Gastrointestinal (Upper):   Patient denies nausea, vomiting, and indigestion/ heartburn.  Gastrointestinal (Lower):   Patient denies diarrhea and constipation.  Constitutional:   Patient denies fever, night sweats, weight loss, and fatigue.  Skin:   Patient denies skin rash/ lesion and itching.  Eyes:   Patient denies blurred vision and double vision.  Ears/ Nose/ Throat:   Patient denies sore throat and sinus problems.  Hematologic/Lymphatic:   Patient denies swollen glands and easy bruising.  Cardiovascular:   Patient denies leg swelling and chest pains.  Respiratory:   Patient denies cough and shortness of breath.  Endocrine:   Patient denies excessive thirst.  Musculoskeletal:   Patient denies back pain and joint pain.  Neurological:   Patient denies headaches and dizziness.  Psychologic:   Patient denies depression and anxiety.   VITAL SIGNS:      10/19/2022 01:24 PM  BP 161/95 mmHg  Pulse 66 /min  Temperature 98.0 F / 36.6 C   MULTI-SYSTEM PHYSICAL EXAMINATION:    Constitutional: Well-nourished. No physical deformities. Normally developed. Good grooming.  Neck: Neck symmetrical, not swollen. Normal tracheal position.  Respiratory: Normal breath sounds. No labored breathing, no use of accessory muscles.   Cardiovascular: Regular rate and rhythm. No murmur, no gallop. Normal temperature, normal extremity pulses, no swelling, no varicosities.   Skin: No paleness, no jaundice, no cyanosis. No lesion, no ulcer, no rash.  Neurologic / Psychiatric: Oriented to time, oriented to place,  oriented to person. No depression, no anxiety, no agitation.  Gastrointestinal: Reducible umbilical hernia. No mass, no tenderness, no rigidity, non obese abdomen.   Musculoskeletal: Normal gait and station of head and neck.     Complexity of Data:  Source Of History:  Patient, Medical Record Summary  Lab Test Review:   PSA  Records Review:   Pathology Reports, Previous Doctor Records, Previous Hospital Records, Previous Patient Records  Urine Test Review:   Urinalysis  X-Ray Review: MRI Prostate GSORAD: Reviewed Report.     06/04/22 12/16/21  PSA  Total PSA 4.78 ng/mL 4.74 ng/mL    10/19/22  Urinalysis  Urine Appearance Clear   Urine Color Yellow   Urine Glucose Neg mg/dL  Urine Bilirubin Neg mg/dL  Urine Ketones Neg mg/dL  Urine Specific Gravity 1.025   Urine Blood Neg ery/uL  Urine pH 5.5   Urine Protein Neg mg/dL  Urine Urobilinogen 0.2 mg/dL  Urine Nitrites Neg   Urine Leukocyte Esterase Neg leu/uL   PROCEDURES:          Visit Complexity - G2211          Urinalysis - 81003 Dipstick Dipstick Cont'd  Color: Yellow Bilirubin: Neg mg/dL  Appearance: Clear Ketones: Neg mg/dL  Specific Gravity: 9.604 Blood: Neg ery/uL  pH: 5.5 Protein: Neg mg/dL  Glucose: Neg mg/dL Urobilinogen: 0.2 mg/dL    Nitrites: Neg    Leukocyte Esterase: Neg leu/uL    ASSESSMENT:      ICD-10 Details  1 GU:   Prostate Cancer - C61 Chronic, Threat to Bodily Function  2 NON-GU:   Encounter for other preprocedural examination - Z01.818 Undiagnosed New Problem   PLAN:           Orders Labs Urine Culture          Schedule Return Visit/Planned Activity: Keep Scheduled Appointment - Follow up MD, Schedule Surgery          Document Letter(s):  Created for Patient: Clinical Summary         Notes:   All questions answered to the best my ability regarding the upcoming procedure and expected postoperative course with understanding expressed by the patient. Precautionary urine culture sent  today to serve as a baseline. He will proceed with previously scheduled robotic prostatectomy and umbilical hernia repair with Dr. Laverle Patter on 6/10.        Next Appointment:      Next Appointment: 11/09/2022 07:15 AM    Appointment Type: Surgery     Location: [facility] [phone]    Provider: Heloise Purpura, M.D.    Reason for Visit: WL/OBS RA LAP RAD PROSTATECTOMY LEV 2 AND BPLND, UMBILICAL HERNIA REPAIR      * Signed by Anne Fu, NP on 10/19/22 at 1:40 PM (EDT)*

## 2022-11-08 NOTE — Anesthesia Preprocedure Evaluation (Signed)
Anesthesia Evaluation  Patient identified by MRN, date of birth, ID band Patient awake    Reviewed: Allergy & Precautions, NPO status , Patient's Chart, lab work & pertinent test results, reviewed documented beta blocker date and time   Airway Mallampati: II  TM Distance: >3 FB Neck ROM: Full    Dental no notable dental hx. (+) Teeth Intact, Dental Advisory Given   Pulmonary neg pulmonary ROS   Pulmonary exam normal breath sounds clear to auscultation       Cardiovascular hypertension, Pt. on home beta blockers and Pt. on medications Normal cardiovascular exam Rhythm:Regular Rate:Normal  HLD   Neuro/Psych negative neurological ROS  negative psych ROS   GI/Hepatic Neg liver ROS,GERD  ,,  Endo/Other  negative endocrine ROS    Renal/GU negative Renal ROS  negative genitourinary   Musculoskeletal negative musculoskeletal ROS (+)    Abdominal   Peds  Hematology negative hematology ROS (+)   Anesthesia Other Findings   Reproductive/Obstetrics                             Anesthesia Physical Anesthesia Plan  ASA: 2  Anesthesia Plan: General   Post-op Pain Management: Tylenol PO (pre-op)*   Induction: Intravenous  PONV Risk Score and Plan: 2 and Midazolam, Dexamethasone and Ondansetron  Airway Management Planned: Oral ETT  Additional Equipment:   Intra-op Plan:   Post-operative Plan: Extubation in OR  Informed Consent: I have reviewed the patients History and Physical, chart, labs and discussed the procedure including the risks, benefits and alternatives for the proposed anesthesia with the patient or authorized representative who has indicated his/her understanding and acceptance.     Dental advisory given  Plan Discussed with: CRNA  Anesthesia Plan Comments: (2 IVs)       Anesthesia Quick Evaluation

## 2022-11-09 ENCOUNTER — Ambulatory Visit (HOSPITAL_COMMUNITY): Payer: Medicare Other | Admitting: Anesthesiology

## 2022-11-09 ENCOUNTER — Encounter (HOSPITAL_COMMUNITY): Payer: Self-pay | Admitting: Urology

## 2022-11-09 ENCOUNTER — Ambulatory Visit (HOSPITAL_BASED_OUTPATIENT_CLINIC_OR_DEPARTMENT_OTHER): Payer: Medicare Other | Admitting: Anesthesiology

## 2022-11-09 ENCOUNTER — Observation Stay (HOSPITAL_COMMUNITY)
Admission: RE | Admit: 2022-11-09 | Discharge: 2022-11-10 | Disposition: A | Discharge status: Home / Self Care | Payer: Medicare Other | Source: Ambulatory Visit | Attending: Urology | Admitting: Urology

## 2022-11-09 ENCOUNTER — Other Ambulatory Visit: Payer: Self-pay

## 2022-11-09 ENCOUNTER — Encounter (HOSPITAL_COMMUNITY)
Admission: RE | Disposition: A | Discharge status: Home / Self Care | Payer: Self-pay | Source: Ambulatory Visit | Attending: Urology

## 2022-11-09 DIAGNOSIS — Z79899 Other long term (current) drug therapy: Secondary | ICD-10-CM | POA: Insufficient documentation

## 2022-11-09 DIAGNOSIS — I10 Essential (primary) hypertension: Secondary | ICD-10-CM | POA: Diagnosis not present

## 2022-11-09 DIAGNOSIS — C61 Malignant neoplasm of prostate: Secondary | ICD-10-CM

## 2022-11-09 DIAGNOSIS — Z8546 Personal history of malignant neoplasm of prostate: Secondary | ICD-10-CM | POA: Diagnosis present

## 2022-11-09 HISTORY — PX: ROBOT ASSISTED LAPAROSCOPIC RADICAL PROSTATECTOMY: SHX5141

## 2022-11-09 HISTORY — PX: LYMPHADENECTOMY: SHX5960

## 2022-11-09 HISTORY — PX: UMBILICAL HERNIA REPAIR: SHX196

## 2022-11-09 LAB — HEMOGLOBIN AND HEMATOCRIT, BLOOD
HCT: 39.2 % (ref 39.0–52.0)
Hemoglobin: 12.9 g/dL — ABNORMAL LOW (ref 13.0–17.0)

## 2022-11-09 LAB — GLUCOSE, CAPILLARY: Glucose-Capillary: 140 mg/dL — ABNORMAL HIGH (ref 70–99)

## 2022-11-09 LAB — TYPE AND SCREEN: Antibody Screen: NEGATIVE

## 2022-11-09 LAB — ABO/RH: ABO/RH(D): A POS

## 2022-11-09 SURGERY — XI ROBOTIC ASSISTED LAPAROSCOPIC RADICAL PROSTATECTOMY LEVEL 2
Anesthesia: General

## 2022-11-09 MED ORDER — ONDANSETRON HCL 4 MG/2ML IJ SOLN
INTRAMUSCULAR | Status: DC | PRN
  Administered 2022-11-09: 4 mg via INTRAVENOUS

## 2022-11-09 MED ORDER — SULFAMETHOXAZOLE-TRIMETHOPRIM 800-160 MG PO TABS: 1.0000 | ORAL_TABLET | Freq: Two times a day (BID) | ORAL | 0 refills | Status: DC

## 2022-11-09 MED ORDER — TRAMADOL HCL 50 MG PO TABS: 50.0000 mg | ORAL_TABLET | Freq: Four times a day (QID) | ORAL | 0 refills | Status: DC | PRN

## 2022-11-09 MED ORDER — PHENYLEPHRINE HCL (PRESSORS) 10 MG/ML IV SOLN
INTRAVENOUS | Status: AC
  Filled 2022-11-09: qty 1

## 2022-11-09 MED ORDER — MIDAZOLAM HCL 5 MG/5ML IJ SOLN
INTRAMUSCULAR | Status: DC | PRN
  Administered 2022-11-09: 2 mg via INTRAVENOUS

## 2022-11-09 MED ORDER — LACTATED RINGERS IV SOLN: INTRAVENOUS | Status: DC

## 2022-11-09 MED ORDER — BUPIVACAINE HCL 0.25 % IJ SOLN
INTRAMUSCULAR | Status: AC
  Filled 2022-11-09: qty 1

## 2022-11-09 MED ORDER — DIPHENHYDRAMINE HCL 50 MG/ML IJ SOLN: 12.5000 mg | Freq: Four times a day (QID) | INTRAMUSCULAR | Status: DC | PRN

## 2022-11-09 MED ORDER — MIDAZOLAM HCL 2 MG/2ML IJ SOLN
INTRAMUSCULAR | Status: AC
  Filled 2022-11-09: qty 2

## 2022-11-09 MED ORDER — STERILE WATER FOR IRRIGATION IR SOLN
Status: DC | PRN
  Administered 2022-11-09: 1000 mL

## 2022-11-09 MED ORDER — CHLORHEXIDINE GLUCONATE 0.12 % MT SOLN
15.0000 mL | Freq: Once | OROMUCOSAL | Status: AC
  Administered 2022-11-09: 15 mL via OROMUCOSAL

## 2022-11-09 MED ORDER — LIDOCAINE HCL (PF) 2 % IJ SOLN
INTRAMUSCULAR | Status: DC | PRN
  Administered 2022-11-09: 1.5 mg/kg/h via INTRADERMAL

## 2022-11-09 MED ORDER — KETAMINE HCL 50 MG/5ML IJ SOSY
PREFILLED_SYRINGE | INTRAMUSCULAR | Status: AC
  Filled 2022-11-09: qty 5

## 2022-11-09 MED ORDER — FENTANYL CITRATE (PF) 100 MCG/2ML IJ SOLN
INTRAMUSCULAR | Status: DC | PRN
  Administered 2022-11-09 (×3): 50 ug via INTRAVENOUS

## 2022-11-09 MED ORDER — KCL IN DEXTROSE-NACL 20-5-0.45 MEQ/L-%-% IV SOLN
INTRAVENOUS | Status: DC
  Filled 2022-11-09 (×3): qty 1000

## 2022-11-09 MED ORDER — ROCURONIUM BROMIDE 10 MG/ML (PF) SYRINGE
PREFILLED_SYRINGE | INTRAVENOUS | Status: AC
  Filled 2022-11-09: qty 10

## 2022-11-09 MED ORDER — DOCUSATE SODIUM 100 MG PO CAPS: 100.0000 mg | ORAL_CAPSULE | Freq: Two times a day (BID) | ORAL | Status: DC

## 2022-11-09 MED ORDER — CEFAZOLIN SODIUM-DEXTROSE 1-4 GM/50ML-% IV SOLN
1.0000 g | Freq: Three times a day (TID) | INTRAVENOUS | Status: AC
  Administered 2022-11-09 – 2022-11-10 (×2): 1 g via INTRAVENOUS
  Filled 2022-11-09 (×2): qty 50

## 2022-11-09 MED ORDER — CEFAZOLIN SODIUM-DEXTROSE 2-4 GM/100ML-% IV SOLN
2.0000 g | INTRAVENOUS | Status: AC
  Administered 2022-11-09: 2 g via INTRAVENOUS
  Filled 2022-11-09: qty 100

## 2022-11-09 MED ORDER — ACETAMINOPHEN 500 MG PO TABS
1000.0000 mg | ORAL_TABLET | Freq: Once | ORAL | Status: AC
  Administered 2022-11-09: 1000 mg via ORAL
  Filled 2022-11-09: qty 2

## 2022-11-09 MED ORDER — GLYCOPYRROLATE 0.2 MG/ML IJ SOLN
INTRAMUSCULAR | Status: DC | PRN
  Administered 2022-11-09: .2 mg via INTRAVENOUS
  Administered 2022-11-09: .1 mg via INTRAVENOUS

## 2022-11-09 MED ORDER — ONDANSETRON HCL 4 MG/2ML IJ SOLN: 4.0000 mg | INTRAMUSCULAR | Status: DC | PRN

## 2022-11-09 MED ORDER — SUGAMMADEX SODIUM 200 MG/2ML IV SOLN
INTRAVENOUS | Status: DC | PRN
  Administered 2022-11-09: 200 mg via INTRAVENOUS

## 2022-11-09 MED ORDER — BUPIVACAINE HCL 0.25 % IJ SOLN
INTRAMUSCULAR | Status: DC | PRN
  Administered 2022-11-09: 35 mL

## 2022-11-09 MED ORDER — LIDOCAINE HCL (PF) 2 % IJ SOLN
INTRAMUSCULAR | Status: AC
  Filled 2022-11-09: qty 10

## 2022-11-09 MED ORDER — ORAL CARE MOUTH RINSE: 15.0000 mL | Freq: Once | OROMUCOSAL | Status: AC

## 2022-11-09 MED ORDER — TRIPLE ANTIBIOTIC 3.5-400-5000 EX OINT: 1.0000 | TOPICAL_OINTMENT | Freq: Three times a day (TID) | CUTANEOUS | Status: DC | PRN

## 2022-11-09 MED ORDER — DEXAMETHASONE SODIUM PHOSPHATE 10 MG/ML IJ SOLN
INTRAMUSCULAR | Status: AC
  Filled 2022-11-09: qty 1

## 2022-11-09 MED ORDER — EPHEDRINE SULFATE-NACL 50-0.9 MG/10ML-% IV SOSY
PREFILLED_SYRINGE | INTRAVENOUS | Status: DC | PRN
  Administered 2022-11-09 (×2): 5 mg via INTRAVENOUS

## 2022-11-09 MED ORDER — FENTANYL CITRATE PF 50 MCG/ML IJ SOSY
PREFILLED_SYRINGE | INTRAMUSCULAR | Status: AC
  Filled 2022-11-09: qty 2

## 2022-11-09 MED ORDER — DIPHENHYDRAMINE HCL 12.5 MG/5ML PO ELIX: 12.5000 mg | ORAL_SOLUTION | Freq: Four times a day (QID) | ORAL | Status: DC | PRN

## 2022-11-09 MED ORDER — LIDOCAINE HCL (CARDIAC) PF 100 MG/5ML IV SOSY
PREFILLED_SYRINGE | INTRAVENOUS | Status: DC | PRN
  Administered 2022-11-09: 60 mg via INTRAVENOUS

## 2022-11-09 MED ORDER — HYOSCYAMINE SULFATE 0.125 MG SL SUBL: 0.1250 mg | SUBLINGUAL_TABLET | Freq: Four times a day (QID) | SUBLINGUAL | Status: DC | PRN

## 2022-11-09 MED ORDER — PROPOFOL 10 MG/ML IV BOLUS
INTRAVENOUS | Status: DC | PRN
  Administered 2022-11-09: 100 mg via INTRAVENOUS

## 2022-11-09 MED ORDER — SODIUM CHLORIDE 0.9 % IR SOLN
Status: DC | PRN
  Administered 2022-11-09: 1000 mL via INTRAVESICAL

## 2022-11-09 MED ORDER — KETOROLAC TROMETHAMINE 15 MG/ML IJ SOLN
15.0000 mg | Freq: Four times a day (QID) | INTRAMUSCULAR | Status: DC
  Administered 2022-11-09 – 2022-11-10 (×4): 15 mg via INTRAVENOUS
  Filled 2022-11-09 (×4): qty 1

## 2022-11-09 MED ORDER — HEPARIN SODIUM (PORCINE) 1000 UNIT/ML IJ SOLN
INTRAMUSCULAR | Status: AC
  Filled 2022-11-09: qty 1

## 2022-11-09 MED ORDER — FENTANYL CITRATE PF 50 MCG/ML IJ SOSY
25.0000 ug | PREFILLED_SYRINGE | INTRAMUSCULAR | Status: DC | PRN
  Administered 2022-11-09 (×2): 25 ug via INTRAVENOUS

## 2022-11-09 MED ORDER — DOCUSATE SODIUM 100 MG PO CAPS
100.0000 mg | ORAL_CAPSULE | Freq: Two times a day (BID) | ORAL | Status: DC
  Administered 2022-11-09 – 2022-11-10 (×2): 100 mg via ORAL
  Filled 2022-11-09 (×2): qty 1

## 2022-11-09 MED ORDER — LACTATED RINGERS IV SOLN
INTRAVENOUS | Status: DC | PRN
  Administered 2022-11-09: 1000 mL

## 2022-11-09 MED ORDER — DEXAMETHASONE SODIUM PHOSPHATE 10 MG/ML IJ SOLN
INTRAMUSCULAR | Status: DC | PRN
  Administered 2022-11-09: 5 mg via INTRAVENOUS

## 2022-11-09 MED ORDER — FENTANYL CITRATE (PF) 250 MCG/5ML IJ SOLN
INTRAMUSCULAR | Status: AC
  Filled 2022-11-09: qty 5

## 2022-11-09 MED ORDER — ROCURONIUM BROMIDE 100 MG/10ML IV SOLN
INTRAVENOUS | Status: DC | PRN
  Administered 2022-11-09: 80 mg via INTRAVENOUS

## 2022-11-09 MED ORDER — AMLODIPINE BESYLATE 10 MG PO TABS
5.0000 mg | ORAL_TABLET | Freq: Every day | ORAL | Status: DC
  Administered 2022-11-10: 5 mg via ORAL
  Filled 2022-11-09: qty 1

## 2022-11-09 MED ORDER — PHENYLEPHRINE HCL (PRESSORS) 10 MG/ML IV SOLN
INTRAVENOUS | Status: DC | PRN
  Administered 2022-11-09: 80 ug via INTRAVENOUS

## 2022-11-09 MED ORDER — PROPOFOL 10 MG/ML IV BOLUS
INTRAVENOUS | Status: AC
  Filled 2022-11-09: qty 20

## 2022-11-09 MED ORDER — SODIUM CHLORIDE 0.9 % IV BOLUS
1000.0000 mL | Freq: Once | INTRAVENOUS | Status: AC
  Administered 2022-11-09: 1000 mL via INTRAVENOUS

## 2022-11-09 MED ORDER — MORPHINE SULFATE (PF) 2 MG/ML IV SOLN: 2.0000 mg | INTRAVENOUS | Status: DC | PRN

## 2022-11-09 MED ORDER — ACETAMINOPHEN 325 MG PO TABS: 650.0000 mg | ORAL_TABLET | ORAL | Status: DC | PRN

## 2022-11-09 MED ORDER — METOPROLOL SUCCINATE ER 50 MG PO TB24
50.0000 mg | ORAL_TABLET | Freq: Every day | ORAL | Status: DC
  Administered 2022-11-10: 50 mg via ORAL
  Filled 2022-11-09: qty 1

## 2022-11-09 MED ORDER — PHENYLEPHRINE HCL-NACL 20-0.9 MG/250ML-% IV SOLN
INTRAVENOUS | Status: DC | PRN
  Administered 2022-11-09: 30 ug/min via INTRAVENOUS

## 2022-11-09 MED ORDER — EPHEDRINE 5 MG/ML INJ
INTRAVENOUS | Status: AC
  Filled 2022-11-09: qty 5

## 2022-11-09 MED ORDER — ZOLPIDEM TARTRATE 5 MG PO TABS: 5.0000 mg | ORAL_TABLET | Freq: Every evening | ORAL | Status: DC | PRN

## 2022-11-09 MED ORDER — ORAL CARE MOUTH RINSE: 15.0000 mL | OROMUCOSAL | Status: DC | PRN

## 2022-11-09 MED ORDER — ONDANSETRON HCL 4 MG/2ML IJ SOLN
INTRAMUSCULAR | Status: AC
  Filled 2022-11-09: qty 2

## 2022-11-09 MED ORDER — LACTATED RINGERS IV SOLN: INTRAVENOUS | Status: DC | PRN

## 2022-11-09 MED ORDER — LABETALOL HCL 5 MG/ML IV SOLN
INTRAVENOUS | Status: AC
  Filled 2022-11-09: qty 4

## 2022-11-09 SURGICAL SUPPLY — 69 items
ADH SKN CLS APL DERMABOND .7 (GAUZE/BANDAGES/DRESSINGS) ×2
APL PRP STRL LF DISP 70% ISPRP (MISCELLANEOUS) ×2
APL SWBSTK 6 STRL LF DISP (MISCELLANEOUS) ×2
APPLICATOR COTTON TIP 6 STRL (MISCELLANEOUS) ×3 IMPLANT
APPLICATOR COTTON TIP 6IN STRL (MISCELLANEOUS) ×2
BAG COUNTER SPONGE SURGICOUNT (BAG) IMPLANT
BAG SPNG CNTER NS LX DISP (BAG)
CATH FOLEY 2WAY SLVR 18FR 30CC (CATHETERS) ×3 IMPLANT
CATH ROBINSON RED A/P 16FR (CATHETERS) ×3 IMPLANT
CATH ROBINSON RED A/P 8FR (CATHETERS) ×3 IMPLANT
CATH TIEMANN FOLEY 18FR 5CC (CATHETERS) ×3 IMPLANT
CHLORAPREP W/TINT 26 (MISCELLANEOUS) ×3 IMPLANT
CLIP LIGATING HEM O LOK PURPLE (MISCELLANEOUS) ×3 IMPLANT
COVER SURGICAL LIGHT HANDLE (MISCELLANEOUS) ×3 IMPLANT
COVER TIP SHEARS 8 DVNC (MISCELLANEOUS) ×3 IMPLANT
CUTTER ECHEON FLEX ENDO 45 340 (ENDOMECHANICALS) ×3 IMPLANT
DERMABOND ADVANCED .7 DNX12 (GAUZE/BANDAGES/DRESSINGS) ×3 IMPLANT
DRAIN CHANNEL RND F F (WOUND CARE) IMPLANT
DRAPE ARM DVNC X/XI (DISPOSABLE) ×12 IMPLANT
DRAPE COLUMN DVNC XI (DISPOSABLE) ×3 IMPLANT
DRAPE SURG IRRIG POUCH 19X23 (DRAPES) ×3 IMPLANT
DRIVER NDL LRG 8 DVNC XI (INSTRUMENTS) ×6 IMPLANT
DRIVER NDLE LRG 8 DVNC XI (INSTRUMENTS) ×4 IMPLANT
DRSG TEGADERM 4X4.75 (GAUZE/BANDAGES/DRESSINGS) ×3 IMPLANT
ELECT PENCIL ROCKER SW 15FT (MISCELLANEOUS) ×3 IMPLANT
ELECT REM PT RETURN 15FT ADLT (MISCELLANEOUS) ×3 IMPLANT
FORCEPS BPLR LNG DVNC XI (INSTRUMENTS) ×3 IMPLANT
FORCEPS PROGRASP DVNC XI (FORCEP) ×3 IMPLANT
GAUZE 4X4 16PLY ~~LOC~~+RFID DBL (SPONGE) ×3 IMPLANT
GAUZE SPONGE 4X4 12PLY STRL (GAUZE/BANDAGES/DRESSINGS) ×3 IMPLANT
GLOVE BIO SURGEON STRL SZ 6.5 (GLOVE) ×3 IMPLANT
GLOVE SURG LX STRL 7.5 STRW (GLOVE) ×6 IMPLANT
GOWN SRG XL LVL 4 BRTHBL STRL (GOWNS) ×3 IMPLANT
GOWN STRL NON-REIN XL LVL4 (GOWNS) ×2
GOWN STRL REUS W/ TWL XL LVL3 (GOWN DISPOSABLE) ×6 IMPLANT
GOWN STRL REUS W/TWL XL LVL3 (GOWN DISPOSABLE) ×4
HOLDER FOLEY CATH W/STRAP (MISCELLANEOUS) ×3 IMPLANT
IRRIG SUCT STRYKERFLOW 2 WTIP (MISCELLANEOUS) ×2
IRRIGATION SUCT STRKRFLW 2 WTP (MISCELLANEOUS) ×3 IMPLANT
IV LACTATED RINGERS 1000ML (IV SOLUTION) ×3 IMPLANT
KIT TURNOVER KIT A (KITS) IMPLANT
NDL SAFETY ECLIP 18X1.5 (MISCELLANEOUS) ×3 IMPLANT
PACK ROBOT UROLOGY CUSTOM (CUSTOM PROCEDURE TRAY) ×3 IMPLANT
PLUG CATH AND CAP STER (CATHETERS) IMPLANT
RELOAD STAPLE 45 4.1 GRN THCK (STAPLE) ×3 IMPLANT
SCISSORS MNPLR CVD DVNC XI (INSTRUMENTS) ×3 IMPLANT
SEAL UNIV 5-12 XI (MISCELLANEOUS) ×9 IMPLANT
SET CYSTO W/LG BORE CLAMP LF (SET/KITS/TRAYS/PACK) IMPLANT
SET TUBE SMOKE EVAC HIGH FLOW (TUBING) ×3 IMPLANT
SOL ELECTROSURG ANTI STICK (MISCELLANEOUS) ×2
SOLUTION ELECTROSURG ANTI STCK (MISCELLANEOUS) ×3 IMPLANT
SPIKE FLUID TRANSFER (MISCELLANEOUS) ×3 IMPLANT
STAPLE RELOAD 45 GRN (STAPLE) ×2 IMPLANT
STAPLE RELOAD 45MM GREEN (STAPLE) ×2
SUT ETHILON 3 0 PS 1 (SUTURE) ×3 IMPLANT
SUT MNCRL 3 0 RB1 (SUTURE) ×3 IMPLANT
SUT MNCRL 3 0 VIOLET RB1 (SUTURE) ×3 IMPLANT
SUT MNCRL AB 4-0 PS2 18 (SUTURE) ×6 IMPLANT
SUT NOVA 0 T19/GS 22DT (SUTURE) IMPLANT
SUT PDS PLUS AB 0 CT-2 (SUTURE) ×6 IMPLANT
SUT VIC AB 0 CT1 27 (SUTURE) ×4
SUT VIC AB 0 CT1 27XBRD ANTBC (SUTURE) ×6 IMPLANT
SUT VIC AB 2-0 SH 27 (SUTURE) ×2
SUT VIC AB 2-0 SH 27X BRD (SUTURE) ×3 IMPLANT
SYR 27GX1/2 1ML LL SAFETY (SYRINGE) ×3 IMPLANT
TOWEL OR NON WOVEN STRL DISP B (DISPOSABLE) ×3 IMPLANT
TROCAR Z THREAD OPTICAL 12X100 (TROCAR) IMPLANT
TROCAR Z-THREAD FIOS 5X100MM (TROCAR) IMPLANT
WATER STERILE IRR 1000ML POUR (IV SOLUTION) ×3 IMPLANT

## 2022-11-09 NOTE — Anesthesia Postprocedure Evaluation (Signed)
Anesthesia Post Note  Patient: Albert Walls  Procedure(s) Performed: XI ROBOTIC ASSISTED LAPAROSCOPIC RADICAL PROSTATECTOMY LEVEL 2 BILATERAL PELVIC LYMPHADENECTOMY (Bilateral) HERNIA REPAIR UMBILICAL ADULT     Patient location during evaluation: PACU Anesthesia Type: General Level of consciousness: awake and alert Pain management: pain level controlled Vital Signs Assessment: post-procedure vital signs reviewed and stable Respiratory status: spontaneous breathing, nonlabored ventilation, respiratory function stable and patient connected to nasal cannula oxygen Cardiovascular status: blood pressure returned to baseline and stable Postop Assessment: no apparent nausea or vomiting Anesthetic complications: no  No notable events documented.  Last Vitals:  Vitals:   11/09/22 1230 11/09/22 1245  BP: 133/82 134/81  Pulse: 61 (!) 56  Resp: 11 16  Temp:  (!) 36.4 C  SpO2: 97% 99%    Last Pain:  Vitals:   11/09/22 1245  TempSrc:   PainSc: 4                  Nevia Henkin L Harm Jou

## 2022-11-09 NOTE — Transfer of Care (Signed)
Immediate Anesthesia Transfer of Care Note  Patient: Albert Walls  Procedure(s) Performed: Procedure(s) with comments: XI ROBOTIC ASSISTED LAPAROSCOPIC RADICAL PROSTATECTOMY LEVEL 2 (N/A) - 210 MINUTES NEEDED FOR CASE BILATERAL PELVIC LYMPHADENECTOMY (Bilateral) HERNIA REPAIR UMBILICAL ADULT (N/A)  Patient Location: PACU  Anesthesia Type:General  Level of Consciousness: Alert, Awake, Oriented  Airway & Oxygen Therapy: Patient Spontanous Breathing  Post-op Assessment: Report given to RN  Post vital signs: Reviewed and stable  Last Vitals:  Vitals:   11/09/22 0620  BP: (!) 160/90  Pulse: (!) 54  Resp: 18  Temp: 37 C  SpO2: 97%    Complications: No apparent anesthesia complications

## 2022-11-09 NOTE — Op Note (Signed)
Preoperative diagnosis: Clinically localized adenocarcinoma of the prostate (clinical stage T1c N0 M0)  Postoperative diagnosis: Clinically localized adenocarcinoma of the prostate (clinical stage T1c N0 M0)  Procedure:  Robotic assisted laparoscopic radical prostatectomy (left nerve sparing) Bilateral robotic assisted laparoscopic pelvic lymphadenectomy  Surgeon: Moody Bruins. M.D.  Assistant(s): Harrie Foreman, PA-C  An assistant was required for this surgical procedure.  The duties of the assistant included but were not limited to suctioning, passing suture, camera manipulation, retraction. This procedure would not be able to be performed without an Geophysicist/field seismologist.   Resident: Dr. Michaele Offer  Anesthesia: General  Complications: None  EBL: 50 mL  IVF:  1700 mL crystalloid  Specimens: Prostate and seminal vesicles Right pelvic lymph nodes Left pelvic lymph nodes  Disposition of specimens: Pathology  Drains: 20 Fr coude catheter # 19 Blake pelvic drain  Indication: Albert Walls is a 72 y.o. patient with clinically localized prostate cancer.  After a thorough review of the management options for treatment of prostate cancer, he elected to proceed with surgical therapy and the above procedure(s).  We have discussed the potential benefits and risks of the procedure, side effects of the proposed treatment, the likelihood of the patient achieving the goals of the procedure, and any potential problems that might occur during the procedure or recuperation. Informed consent has been obtained.  Description of procedure:  The patient was taken to the operating room and a general anesthetic was administered. He was given preoperative antibiotics, placed in the dorsal lithotomy position, and prepped and draped in the usual sterile fashion. Next a preoperative timeout was performed. A urethral catheter was placed into the bladder and a site was selected near the umbilicus for  placement of the camera port. This was placed using a standard open Hassan technique which allowed entry into the peritoneal cavity under direct vision and without difficulty. An 8 mm port was placed and a pneumoperitoneum established. The camera was then used to inspect the abdomen and there was no evidence of any intra-abdominal injuries or other abnormalities. The remaining abdominal ports were then placed. 8 mm robotic ports were placed in the right lower quadrant, left lower quadrant, and far left lateral abdominal wall. A 5 mm port was placed in the right upper quadrant and a 12 mm port was placed in the right lateral abdominal wall for laparoscopic assistance. All ports were placed under direct vision without difficulty. The surgical cart was then docked.   Utilizing the cautery scissors, the bladder was reflected posteriorly allowing entry into the space of Retzius and identification of the endopelvic fascia and prostate. The periprostatic fat was then removed from the prostate allowing full exposure of the endopelvic fascia. The endopelvic fascia was then incised from the apex back to the base of the prostate bilaterally and the underlying levator muscle fibers were swept laterally off the prostate thereby isolating the dorsal venous complex. The dorsal vein was then stapled and divided with a 45 mm Flex Echelon stapler. Attention then turned to the bladder neck which was divided anteriorly thereby allowing entry into the bladder and exposure of the urethral catheter. The catheter balloon was deflated and the catheter was brought into the operative field and used to retract the prostate anteriorly. The posterior bladder neck was then examined and was divided allowing further dissection between the bladder and prostate posteriorly until the vasa deferentia and seminal vessels were identified. The vasa deferentia were isolated, divided, and lifted anteriorly. The seminal vesicles were  dissected down to  their tips with care to control the seminal vascular arterial blood supply. These structures were then lifted anteriorly and the space between Denonvillier's fascia and the anterior rectum was developed with a combination of sharp and blunt dissection. This isolated the vascular pedicles of the prostate.  The lateral prostatic fascia on the left side of the prostate was then sharply incised allowing release of the neurovascular bundle. The vascular pedicle of the prostate on the left side was then ligated with Weck clips between the prostate and neurovascular bundle and divided with sharp cold scissor dissection resulting in neurovascular bundle preservation. On the right side, a wide non nerve sparing dissection was performed with Weck clips used to ligate the vascular pedicle of the prostate. The neurovascular bundle on the left side was then separated off the apex of the prostate and urethra.   The urethra was then sharply transected allowing the prostate specimen to be disarticulated. The pelvis was copiously irrigated and hemostasis was ensured. There was no evidence for rectal injury.  Attention then turned to the right pelvic sidewall. The fibrofatty tissue between the external iliac vein, confluence of the iliac vessels, hypogastric artery, and Cooper's ligament was dissected free from the pelvic sidewall with care to preserve the obturator nerve. Weck clips were used for lymphostasis and hemostasis. An identical procedure was performed on the contralateral side and the lymphatic packets were removed for permanent pathologic analysis.  Attention then turned to the urethral anastomosis. A 2-0 Vicryl slip knot was placed between Denonvillier's fascia, the posterior bladder neck, and the posterior urethra to reapproximate these structures. A double-armed 3-0 Monocryl suture was then used to perform a 360 running tension-free anastomosis between the bladder neck and urethra. A new urethral catheter was  then placed into the bladder and irrigated. There were no blood clots within the bladder and the anastomosis appeared to be watertight. A #19 Blake drain was then brought through the left lateral 8 mm port site and positioned appropriately within the pelvis. It was secured to the skin with a nylon suture. The surgical cart was then undocked. The right lateral 12 mm port site was closed at the fascial level with a 0 Vicryl suture placed laparoscopically. All remaining ports were then removed under direct vision. The prostate specimen was removed intact within the Endopouch retrieval bag via the periumbilical camera port site. This fascial opening was closed with two running 0 PDS sutures. 0.25% Marcaine was then injected into all port sites and all incisions were reapproximated at the skin level with 4-0 Monocryl subcuticular sutures and Dermabond. The patient appeared to tolerate the procedure well and without complications. The patient was able to be extubated and transferred to the recovery unit in satisfactory condition.   Moody Bruins MD

## 2022-11-09 NOTE — Discharge Instructions (Signed)

## 2022-11-09 NOTE — Anesthesia Procedure Notes (Signed)
Procedure Name: Intubation Date/Time: 11/09/2022 8:25 AM  Performed by: Johnette Abraham, CRNAPre-anesthesia Checklist: Patient identified, Emergency Drugs available, Suction available and Patient being monitored Patient Re-evaluated:Patient Re-evaluated prior to induction Oxygen Delivery Method: Circle System Utilized Preoxygenation: Pre-oxygenation with 100% oxygen Induction Type: IV induction Ventilation: Mask ventilation without difficulty Laryngoscope Size: Mac and 4 Grade View: Grade II Tube type: Oral Tube size: 8.0 mm Number of attempts: 1 Airway Equipment and Method: Stylet and Oral airway Placement Confirmation: ETT inserted through vocal cords under direct vision, positive ETCO2 and breath sounds checked- equal and bilateral Secured at: 22 cm Tube secured with: Tape Dental Injury: Teeth and Oropharynx as per pre-operative assessment

## 2022-11-09 NOTE — Interval H&P Note (Signed)
History and Physical Interval Note:  11/09/2022 6:46 AM  Albert Walls  has presented today for surgery, with the diagnosis of PROSTATE CANCER.  The various methods of treatment have been discussed with the patient and family. After consideration of risks, benefits and other options for treatment, the patient has consented to  Procedure(s) with comments: XI ROBOTIC ASSISTED LAPAROSCOPIC RADICAL PROSTATECTOMY LEVEL 2 (N/A) - 210 MINUTES NEEDED FOR CASE BILATERAL PELVIC LYMPHADENECTOMY (Bilateral) HERNIA REPAIR UMBILICAL ADULT (N/A) as a surgical intervention.  The patient's history has been reviewed, patient examined, no change in status, stable for surgery.  I have reviewed the patient's chart and labs.  Questions were answered to the patient's satisfaction.     [employer]

## 2022-11-10 ENCOUNTER — Encounter (HOSPITAL_COMMUNITY): Payer: Self-pay | Admitting: Urology

## 2022-11-10 DIAGNOSIS — C61 Malignant neoplasm of prostate: Secondary | ICD-10-CM | POA: Diagnosis not present

## 2022-11-10 DIAGNOSIS — Z79899 Other long term (current) drug therapy: Secondary | ICD-10-CM | POA: Diagnosis not present

## 2022-11-10 DIAGNOSIS — I10 Essential (primary) hypertension: Secondary | ICD-10-CM | POA: Diagnosis not present

## 2022-11-10 LAB — HEMOGLOBIN AND HEMATOCRIT, BLOOD
HCT: 34.6 % — ABNORMAL LOW (ref 39.0–52.0)
Hemoglobin: 11.6 g/dL — ABNORMAL LOW (ref 13.0–17.0)

## 2022-11-10 MED ORDER — BISACODYL 10 MG RE SUPP
10.0000 mg | Freq: Once | RECTAL | Status: AC
  Administered 2022-11-10: 10 mg via RECTAL
  Filled 2022-11-10: qty 1

## 2022-11-10 MED ORDER — TRAMADOL HCL 50 MG PO TABS: 50.0000 mg | ORAL_TABLET | Freq: Four times a day (QID) | ORAL | Status: DC | PRN

## 2022-11-10 NOTE — Progress Notes (Signed)
1 Day Post-Op Subjective: The patient is doing well.  No nausea or vomiting. Pain is adequately controlled.  Objective: Vital signs in last 24 hours: Temp:  [97.5 F (36.4 C)-99.1 F (37.3 C)] 98.6 F (37 C) (06/11 0457) Pulse Rate:  [47-72] 47 (06/11 0457) Resp:  [11-17] 17 (06/10 2340) BP: (129-154)/(67-89) 132/67 (06/11 0457) SpO2:  [93 %-99 %] 94 % (06/11 0457)  Intake/Output from previous day: 06/10 0701 - 06/11 0700 In: 5877 [P.O.:600; I.V.:4127; IV Piggyback:1150.1] Out: 3500 [Urine:3375; Drains:75; Blood:50] Intake/Output this shift: No intake/output data recorded.  Physical Exam:  General: Alert and oriented. CV: RRR Lungs: Clear bilaterally. GI: Soft, Nondistended. Incisions: Clean, dry, and intact Urine: Clear Extremities: Nontender, no erythema, no edema.  Lab Results: Recent Labs    11/09/22 1212 11/10/22 0406  HGB 12.9* 11.6*  HCT 39.2 34.6*      Assessment/Plan: POD# 1 s/p robotic prostatectomy.  1) SL IVF 2) Ambulate, Incentive spirometry 3) Transition to oral pain medication 4) Dulcolax suppository 5) D/C pelvic drain 6) Plan for likely discharge later today   LOS: 0 days   Albert Walls 11/10/2022, 7:30 AM

## 2022-11-10 NOTE — Discharge Summary (Signed)
Date of admission: 11/09/2022  Date of discharge: 11/10/2022  Admission diagnosis: Prostate Cancer  Discharge diagnosis: Prostate Cancer  History and Physical: For full details, please see admission history and physical. Briefly, Albert Walls is a 72 y.o. gentleman with localized prostate cancer.  After discussing management/treatment options, he elected to proceed with surgical treatment.  Hospital Course: Albert Walls was taken to the operating room on 11/09/2022 and underwent a robotic assisted laparoscopic radical prostatectomy and umbilical hernia repair. He tolerated this procedure well and without complications. Postoperatively, he was able to be transferred to a regular hospital room following recovery from anesthesia.  He was able to begin ambulating the night of surgery. He remained hemodynamically stable overnight.  He had excellent urine output with appropriately minimal output from his pelvic drain and his pelvic drain was removed on POD #1.  He was transitioned to oral pain medication, tolerated a clear liquid diet, and had met all discharge criteria and was able to be discharged home later on POD#1.  Laboratory values:  Recent Labs    11/09/22 1212 11/10/22 0406  HGB 12.9* 11.6*  HCT 39.2 34.6*    Disposition: Home  Discharge instruction: He was instructed to be ambulatory but to refrain from heavy lifting, strenuous activity, or driving. He was instructed on urethral catheter care.  Discharge medications:   Allergies as of 11/10/2022   No Known Allergies      Medication List     STOP taking these medications    EMERGEN-C VITAMIN C PO       TAKE these medications    amLODipine 5 MG tablet Commonly known as: NORVASC TAKE ONE TABLET BY MOUTH DAILY   calcium carbonate 500 MG chewable tablet Commonly known as: TUMS - dosed in mg elemental calcium Chew 2 tablets by mouth daily as needed for indigestion or heartburn.   docusate sodium 100 MG  capsule Commonly known as: COLACE Take 1 capsule (100 mg total) by mouth 2 (two) times daily.   metoprolol succinate 50 MG 24 hr tablet Commonly known as: TOPROL-XL Take 1 tablet (50 mg total) by mouth daily.   metroNIDAZOLE 0.75 % cream Commonly known as: METROCREAM Apply 1 Application topically 2 (two) times daily as needed (rosacea).   sulfamethoxazole-trimethoprim 800-160 MG tablet Commonly known as: BACTRIM DS Take 1 tablet by mouth 2 (two) times daily. Start the day prior to foley removal appointment   Systane Complete PF 0.6 % Soln Generic drug: Propylene Glycol (PF) Place 1 drop into both eyes daily as needed (dry eyes).   traMADol 50 MG tablet Commonly known as: Ultram Take 1-2 tablets (50-100 mg total) by mouth every 6 (six) hours as needed for moderate pain or severe pain.        Followup: He will followup in 1 week for catheter removal and to discuss his surgical pathology results.

## 2022-11-10 NOTE — Progress Notes (Signed)
Mobility Specialist - Progress Note   11/10/22 0933  Mobility  Activity Ambulated independently in hallway  Level of Assistance Independent  Assistive Device None  Distance Ambulated (ft) 700 ft  Activity Response Tolerated well  Mobility Referral Yes  $Mobility charge 1 Mobility  Mobility Specialist Start Time (ACUTE ONLY) [id number]  Mobility Specialist Stop Time (ACUTE ONLY) [id number]  Mobility Specialist Time Calculation (min) (ACUTE ONLY) 13 min   Pt received in bed and agreeable to mobility. No complaints during session. Pt to bed after session with all needs met. Bed alarm on.  [facility]

## 2022-11-10 NOTE — Progress Notes (Signed)
Pt given discharge teaching and foley care teaching.  Pt discharged with leg bags, new foley bag, wound care dressings for jp drain.  IV's removed.  Taken to front entrance in wheelchair.

## 2022-11-13 LAB — SURGICAL PATHOLOGY

## 2022-12-10 DIAGNOSIS — M6281 Muscle weakness (generalized): Secondary | ICD-10-CM | POA: Diagnosis not present

## 2022-12-10 DIAGNOSIS — M62838 Other muscle spasm: Secondary | ICD-10-CM | POA: Diagnosis not present

## 2023-02-25 ENCOUNTER — Encounter: Payer: Self-pay | Admitting: Internal Medicine

## 2023-02-25 ENCOUNTER — Other Ambulatory Visit (INDEPENDENT_AMBULATORY_CARE_PROVIDER_SITE_OTHER): Payer: Medicare Other

## 2023-02-25 DIAGNOSIS — I10 Essential (primary) hypertension: Secondary | ICD-10-CM

## 2023-02-25 LAB — LIPID PANEL
Cholesterol: 218 mg/dL — ABNORMAL HIGH (ref 0–200)
HDL: 49.2 mg/dL (ref >39.00)
LDL Cholesterol: 120 mg/dL — ABNORMAL HIGH (ref 0–99)
NonHDL: 169.19
Total CHOL/HDL Ratio: 4
Triglycerides: 244 mg/dL — ABNORMAL HIGH (ref 0.0–149.0)
VLDL: 48.8 mg/dL — ABNORMAL HIGH (ref 0.0–40.0)

## 2023-02-25 LAB — COMPREHENSIVE METABOLIC PANEL
ALT: 14 U/L (ref 0–53)
AST: 18 U/L (ref 0–37)
Albumin: 4 g/dL (ref 3.5–5.2)
Alkaline Phosphatase: 58 U/L (ref 39–117)
BUN: 24 mg/dL — ABNORMAL HIGH (ref 6–23)
CO2: 31 mEq/L (ref 19–32)
Calcium: 9.5 mg/dL (ref 8.4–10.5)
Chloride: 105 mEq/L (ref 96–112)
Creatinine, Ser: 0.95 mg/dL (ref 0.40–1.50)
GFR: 79.76 mL/min (ref >60.00)
Glucose, Bld: 100 mg/dL — ABNORMAL HIGH (ref 70–99)
Potassium: 4.4 mEq/L (ref 3.5–5.1)
Sodium: 142 mEq/L (ref 135–145)
Total Bilirubin: 0.7 mg/dL (ref 0.2–1.2)
Total Protein: 6.7 g/dL (ref 6.0–8.3)

## 2023-02-25 LAB — CBC
HCT: 42.6 % (ref 39.0–52.0)
Hemoglobin: 14.1 g/dL (ref 13.0–17.0)
MCHC: 33.2 g/dL (ref 30.0–36.0)
MCV: 90.8 fl (ref 78.0–100.0)
Platelets: 286 10*3/uL (ref 150.0–400.0)
RBC: 4.69 Mil/uL (ref 4.22–5.81)
RDW: 13.9 % (ref 11.5–15.5)
WBC: 6.4 10*3/uL (ref 4.0–10.5)

## 2023-05-04 DIAGNOSIS — H40013 Open angle with borderline findings, low risk, bilateral: Secondary | ICD-10-CM | POA: Diagnosis not present

## 2023-06-10 DIAGNOSIS — K432 Incisional hernia without obstruction or gangrene: Secondary | ICD-10-CM | POA: Diagnosis not present

## 2023-08-02 ENCOUNTER — Encounter: Payer: Medicare Other | Admitting: Internal Medicine

## 2023-08-02 ENCOUNTER — Ambulatory Visit: Payer: Medicare Other

## 2023-08-12 DIAGNOSIS — L719 Rosacea, unspecified: Secondary | ICD-10-CM | POA: Diagnosis not present

## 2023-08-12 DIAGNOSIS — L710 Perioral dermatitis: Secondary | ICD-10-CM | POA: Diagnosis not present

## 2023-09-07 ENCOUNTER — Ambulatory Visit (INDEPENDENT_AMBULATORY_CARE_PROVIDER_SITE_OTHER): Payer: Medicare Other

## 2023-09-07 ENCOUNTER — Ambulatory Visit: Payer: Medicare Other | Admitting: Internal Medicine

## 2023-09-07 ENCOUNTER — Encounter: Payer: Self-pay | Admitting: Internal Medicine

## 2023-09-07 VITALS — BP 138/78 | HR 70 | Temp 98.6°F | Ht 64.0 in | Wt 190.0 lb

## 2023-09-07 VITALS — BP 138/78 | HR 70 | Ht 64.0 in | Wt 190.4 lb

## 2023-09-07 DIAGNOSIS — Z0000 Encounter for general adult medical examination without abnormal findings: Secondary | ICD-10-CM

## 2023-09-07 DIAGNOSIS — Z8546 Personal history of malignant neoplasm of prostate: Secondary | ICD-10-CM | POA: Diagnosis not present

## 2023-09-07 DIAGNOSIS — I10 Essential (primary) hypertension: Secondary | ICD-10-CM | POA: Diagnosis not present

## 2023-09-07 DIAGNOSIS — K4090 Unilateral inguinal hernia, without obstruction or gangrene, not specified as recurrent: Secondary | ICD-10-CM | POA: Diagnosis not present

## 2023-09-07 MED ORDER — METOPROLOL SUCCINATE ER 50 MG PO TB24
50.0000 mg | ORAL_TABLET | Freq: Every day | ORAL | 3 refills | Status: AC
Start: 2023-09-07 — End: ?

## 2023-09-07 MED ORDER — AMLODIPINE BESYLATE 5 MG PO TABS
5.0000 mg | ORAL_TABLET | Freq: Every day | ORAL | 3 refills | Status: AC
Start: 2023-09-07 — End: ?

## 2023-09-07 NOTE — Progress Notes (Unsigned)
   Subjective:   Patient ID: Albert Walls, male    DOB: 03-05-1951, 73 y.o.   MRN: [id number]  HPI The patient is here for physical.  PMH, [facility], social history reviewed and updated  Review of Systems  Objective:  Physical Exam  There were no vitals filed for this visit.  Assessment & Plan:

## 2023-09-07 NOTE — Patient Instructions (Addendum)
 [name] , Thank you for taking time to come for your Medicare Wellness Visit. I appreciate your ongoing commitment to your health goals. Please review the following plan we discussed and let me know if I can assist you in the future.   Referrals/Orders/Follow-Ups/Clinician Recommendations: Aim for 30 minutes of exercise or brisk walking, 6-8 glasses of water, and 5 servings of fruits and vegetables each day.   This is a list of the screening recommended for you and due dates:  Health Maintenance  Topic Date Due   DTaP/Tdap/Td vaccine (3 - Td or Tdap) 08/30/2020   Cologuard (Stool DNA test)  08/01/2023   COVID-19 Vaccine (7 - Pfizer risk 2024-25 season) 08/16/2023   Flu Shot  12/31/2023   Medicare Annual Wellness Visit  09/06/2024   Pneumonia Vaccine  Completed   Hepatitis C Screening  Completed   Zoster (Shingles) Vaccine  Completed   HPV Vaccine  Aged Out   Colon Cancer Screening  Discontinued    Advanced directives: (Copy Requested) Please bring a copy of your health care power of attorney and living will to the office to be added to your chart at your convenience. You can mail to [facility] [address] [place], [place] [address] or email to [email or web address]  Next Medicare Annual Wellness Visit scheduled for next year: Yes

## 2023-09-07 NOTE — Patient Instructions (Signed)
 We will check the CT of the stomach and pelvis to check if that is a hernia.

## 2023-09-07 NOTE — Progress Notes (Signed)
 Subjective:   Albert Walls is a 73 y.o. who presents for a Medicare Wellness preventive visit.  Visit Complete: In person  Persons Participating in Visit: Patient.  AWV Questionnaire: No: Patient Medicare AWV questionnaire was not completed prior to this visit.  Cardiac Risk Factors include: advanced age (>51men, >35 women);hypertension;male gender;obesity (BMI >30kg/m2)     Objective:    Today's Vitals   09/07/23 1005  BP: 138/78  Pulse: 70  SpO2: 97%  Weight: 190 lb 6.4 oz (86.4 kg)  Height: 5\' 4"  (1.626 m)   Body mass index is 32.68 kg/m.     09/07/2023   10:03 AM 11/09/2022    5:50 AM 11/05/2022    8:06 AM 08/04/2022   10:17 AM  Advanced Directives  Does Patient Have a Medical Advance Directive? Yes Yes Yes Yes  Type of Estate agent of [place];Living will Healthcare Power of [employer] Power of Attorney Living will;Healthcare Power of Attorney  Does patient want to make changes to medical advance directive?  No - Patient declined  No - Patient declined  Copy of Healthcare Power of Attorney in Chart? No - copy requested No - copy requested No - copy requested No - copy requested    Current Medications (verified) Outpatient Encounter Medications as of 09/07/2023  Medication Sig   amLODipine (NORVASC) 5 MG tablet TAKE ONE TABLET BY MOUTH DAILY   calcium carbonate (TUMS - DOSED IN MG ELEMENTAL CALCIUM) 500 MG chewable tablet Chew 2 tablets by mouth daily as needed for indigestion or heartburn.   docusate sodium (COLACE) 100 MG capsule Take 1 capsule (100 mg total) by mouth 2 (two) times daily.   metoprolol succinate (TOPROL-XL) 50 MG 24 hr tablet Take 1 tablet (50 mg total) by mouth daily.   metroNIDAZOLE (METROCREAM) 0.75 % cream Apply 1 Application topically 2 (two) times daily as needed (rosacea).   Propylene Glycol, PF, (SYSTANE COMPLETE PF) 0.6 % SOLN Place 1 drop into both eyes daily as needed (dry eyes).   [DISCONTINUED]  sulfamethoxazole-trimethoprim (BACTRIM DS) 800-160 MG tablet Take 1 tablet by mouth 2 (two) times daily. Start the day prior to foley removal appointment   [DISCONTINUED] traMADol (ULTRAM) 50 MG tablet Take 1-2 tablets (50-100 mg total) by mouth every 6 (six) hours as needed for moderate pain or severe pain.   No facility-administered encounter medications on file as of 09/07/2023.    Allergies (verified) Patient has no known allergies.   History: Past Medical History:  Diagnosis Date   Cancer (HCC)    prostate   Community acquired pneumonia 06/02/1975   lingula   GERD (gastroesophageal reflux disease)    hx of- diet controlled   HTN (hypertension)    on meds   Hyperlipidemia    diet controlled   Past Surgical History:  Procedure Laterality Date   COLONOSCOPY  2013   Dr. Kaplan-suprep(exc)normal -10 yr recall   LYMPHADENECTOMY Bilateral 11/09/2022   Procedure: BILATERAL PELVIC LYMPHADENECTOMY;  Surgeon: Heloise Purpura, MD;  Location: [facility];  Service: Urology;  Laterality: Bilateral;   ROBOT ASSISTED LAPAROSCOPIC RADICAL PROSTATECTOMY N/A 11/09/2022   Procedure: XI ROBOTIC ASSISTED LAPAROSCOPIC RADICAL PROSTATECTOMY LEVEL 2;  Surgeon: Heloise Purpura, MD;  Location: [facility];  Service: Urology;  Laterality: N/A;  210 MINUTES NEEDED FOR CASE   TONSILLECTOMY AND ADENOIDECTOMY  1956   UMBILICAL HERNIA REPAIR N/A 11/09/2022   Procedure: HERNIA REPAIR UMBILICAL ADULT;  Surgeon: Heloise Purpura, MD;  Location: [facility];  Service: Urology;  Laterality: N/A;   UPPER GASTROINTESTINAL ENDOSCOPY     gastritis   VASECTOMY  06/01/1985   Family History  Problem Relation Age of Onset   Stroke Paternal Aunt 57   Testicular cancer Paternal Uncle    Diabetes Unknown        paternal great uncle   Colon cancer Other        PGGF   Colon cancer Other        Paternal great uncle    Colon cancer Maternal Grandfather    Dementia Mother    Heart disease Neg Hx    Social History   Socioeconomic  History   Marital status: Married    Spouse name: Not on file   Number of children: 4   Years of education: Not on file   Highest education level: Bachelor's degree (e.g., BA, AB, BS)  Occupational History   Occupation: Art gallery manager  Tobacco Use   Smoking status: Never    Passive exposure: Never   Smokeless tobacco: Never  Vaping Use   Vaping status: Never Used  Substance and Sexual Activity   Alcohol use: Yes    Alcohol/week: 9.0 - 12.0 standard drinks of alcohol    Types: 2 Glasses of wine, 7 - 10 Standard drinks or equivalent per week    Comment: 7-10 glasses per week   Drug use: No   Sexual activity: Not on file  Other Topics Concern   Not on file  Social History Narrative   Not on file   Social Drivers of Health   Financial Resource Strain: Low Risk  (09/07/2023)   Overall Financial Resource Strain (CARDIA)    Difficulty of Paying Living Expenses: Not hard at all  Food Insecurity: No Food Insecurity (09/07/2023)   Hunger Vital Sign    Worried About Running Out of Food in the Last Year: Never true    Ran Out of Food in the Last Year: Never true  Transportation Needs: No Transportation Needs (09/07/2023)   PRAPARE - Administrator, Civil Service (Medical): No    Lack of Transportation (Non-Medical): No  Physical Activity: Sufficiently Active (09/07/2023)   Exercise Vital Sign    Days of Exercise per Week: 4 days    Minutes of Exercise per Session: 60 min  Stress: No Stress Concern Present (09/07/2023)   [employer] of Occupational Health - Occupational Stress Questionnaire    Feeling of Stress : Not at all  Social Connections: Moderately Integrated (09/07/2023)   Social Connection and Isolation Panel [NHANES]    Frequency of Communication with Friends and Family: More than three times a week    Frequency of Social Gatherings with Friends and Family: More than three times a week    Attends Religious Services: Never    Database administrator or Organizations: Yes     Attends Engineer, structural: More than 4 times per year    Marital Status: Married    Tobacco Counseling Counseling given: No    Clinical Intake:  Pre-visit preparation completed: Yes  Pain : No/denies pain     BMI - recorded: 32.68 Nutritional Status: BMI > 30  Obese Nutritional Risks: None Diabetes: No  Lab Results  Component Value Date   HGBA1C 5.9 08/17/2022   HGBA1C 5.7 08/01/2021   HGBA1C 6.1 08/12/2015     How often do you need to have someone help you when you read instructions, pamphlets, or other written materials from your doctor or pharmacy?: 1 -  Never  Interpreter Needed?: No  Information entered by :: Hassell Halim, CMA   Activities of Daily Living     09/07/2023   10:07 AM 11/09/2022    1:03 PM  In your present state of health, do you have any difficulty performing the following activities:  Hearing? 0 0  Vision? 0 0  Difficulty concentrating or making decisions? 0 0  Walking or climbing stairs? 0 0  Dressing or bathing? 0 0  Doing errands, shopping? 0 0  Preparing Food and eating ? N   Using the Toilet? N   In the past six months, have you accidently leaked urine? N   Do you have problems with loss of bowel control? N   Managing your Medications? N   Managing your Finances? N   Housekeeping or managing your Housekeeping? N     Patient Care Team: Myrlene Broker, MD as PCP - General (Internal Medicine) [place], Heather, OD (Optometry) [place], Elvin So, MD as Referring Physician (Dermatology)  Indicate any recent Medical Services you may have received from other than [facility] providers in the past year (date may be approximate).     Assessment:   This is a routine wellness examination for Albert Walls.  Hearing/Vision screen Hearing Screening - Comments:: Denies hearing difficulties   Vision Screening - Comments:: Wears rx glasses - up to date with routine eye exams with Dr Heather [place]   Goals Addressed                This Visit's Progress     Patient Stated (pt-stated)        Patient stated he plans to work on his diet (watch calories) with Spouse.       Depression Screen     09/07/2023   10:14 AM 08/17/2022    8:31 AM 08/17/2022    8:30 AM 08/04/2022   10:15 AM 08/01/2021    8:05 AM 06/24/2020    1:59 PM 03/01/2019    1:31 PM  PHQ 2/9 Scores  PHQ - 2 Score 0 0 0 0 0 0 0  PHQ- 9 Score 0 0         Fall Risk     09/07/2023   10:09 AM 08/04/2022   10:13 AM 07/31/2022    4:57 PM 08/01/2021    8:05 AM 06/24/2020    1:59 PM  Fall Risk   Falls in the past year? 1 0 0 0 0  Number falls in past yr: 0 0 0 0 0  Injury with Fall? 0 0 0 0 0  Risk for fall due to :  No Fall Risks     Follow up Falls evaluation completed;Falls prevention discussed Falls prevention discussed       MEDICARE RISK AT HOME:  Medicare Risk at Home Any stairs in or around the home?: Yes If so, are there any without handrails?: No Home free of loose throw rugs in walkways, pet beds, electrical cords, etc?: Yes Adequate lighting in your home to reduce risk of falls?: Yes Life alert?: No Use of a cane, walker or w/c?: No Grab bars in the bathroom?: No Shower chair or bench in shower?: Yes Elevated toilet seat or a handicapped toilet?: No  TIMED UP AND GO:  Was the test performed?  No  Cognitive Function: 6CIT completed        09/07/2023   10:12 AM 08/04/2022   10:18 AM  6CIT Screen  What Year? 0 points 0 points  What  month? 0 points 0 points  What time? 0 points 0 points  Count back from 20 0 points 0 points  Months in reverse 0 points 0 points  Repeat phrase 0 points 0 points  Total Score 0 points 0 points    Immunizations Immunization History  Administered Date(s) Administered   Fluad Quad(high Dose 65+) 02/01/2019, 01/29/2020, 02/13/2022   Hepatitis A 03/19/1998, 02/21/1999   Hepatitis B 03/19/1998, 06/21/1998, 02/21/1999   Influenza, High Dose Seasonal PF 03/02/2017   Influenza,inj,Quad PF,6+ Mos 04/04/2016    Influenza-Unspecified 02/22/2013, 03/16/2015, 04/04/2018   PFIZER(Purple Top)SARS-COV-2 Vaccination 07/07/2019, 08/01/2019, 02/05/2020   Pfizer Covid-19 Vaccine Bivalent Booster 70yrs & up 02/03/2021, 02/20/2022   Pneumococcal Conjugate-13 08/13/2016   Pneumococcal Polysaccharide-23 09/13/2017   Rsv, Mab, Judi Cong, 0.5 Ml, Neonate To 24 Mos(Beyfortus) 02/13/2022   Td 09/28/2008   Tdap 08/31/2010   Unspecified SARS-COV-2 Vaccination 02/16/2023   Zoster Recombinant(Shingrix) 09/21/2017, 02/16/2018    Screening Tests Health Maintenance  Topic Date Due   DTaP/Tdap/Td (3 - Td or Tdap) 08/30/2020   Fecal DNA (Cologuard)  08/01/2023   COVID-19 Vaccine (7 - Pfizer risk 2024-25 season) 08/16/2023   INFLUENZA VACCINE  12/31/2023   Medicare Annual Wellness (AWV)  09/06/2024   Pneumonia Vaccine 22+ Years old  Completed   Hepatitis C Screening  Completed   Zoster Vaccines- Shingrix  Completed   HPV VACCINES  Aged Out   Colonoscopy  Discontinued    Health Maintenance  Health Maintenance Due  Topic Date Due   DTaP/Tdap/Td (3 - Td or Tdap) 08/30/2020   Fecal DNA (Cologuard)  08/01/2023   COVID-19 Vaccine (7 - Pfizer risk 2024-25 season) 08/16/2023   Health Maintenance Items Addressed: 09/07/2023  Colonoscopy status: Completed 06/10/2022 w/Dr Amada Jupiter.  Additional Screening:  Vision Screening: Recommended annual ophthalmology exams for early detection of glaucoma and other disorders of the eye.  Dental Screening: Recommended annual dental exams for proper oral hygiene  Community Resource Referral / Chronic Care Management: CRR required this visit?  No   CCM required this visit?  No     Plan:     I have personally reviewed and noted the following in the patient's chart:   Medical and social history Use of alcohol, tobacco or illicit drugs  Current medications and supplements including opioid prescriptions. Patient is not currently taking opioid  prescriptions. Functional ability and status Nutritional status Physical activity Advanced directives List of other physicians Hospitalizations, surgeries, and ER visits in previous 12 months Vitals Screenings to include cognitive, depression, and falls Referrals and appointments  In addition, I have reviewed and discussed with patient certain preventive protocols, quality metrics, and best practice recommendations. A written personalized care plan for preventive services as well as general preventive health recommendations were provided to patient.     Darreld Mclean, CMA   09/07/2023   After Visit Summary: (In Person-Declined) Patient declined AVS at this time.  Notes: Please refer to Routing Comments.

## 2023-09-09 ENCOUNTER — Encounter: Payer: Self-pay | Admitting: Internal Medicine

## 2023-09-10 DIAGNOSIS — K4090 Unilateral inguinal hernia, without obstruction or gangrene, not specified as recurrent: Secondary | ICD-10-CM | POA: Insufficient documentation

## 2023-09-10 NOTE — Assessment & Plan Note (Signed)
 Getting PSA monitoring through urology.

## 2023-09-10 NOTE — Assessment & Plan Note (Signed)
 Checking CT abdomen and pelvis. This could be complicated due to prior surgery for prostate removal last year.

## 2023-09-10 NOTE — Assessment & Plan Note (Signed)
 BP at goal on amlodipine and metoprolol and continue. Checking labs and adjust as needed.

## 2023-09-10 NOTE — Assessment & Plan Note (Signed)
 Flu shot yearly. Pneumonia complete. Shingrix complete. Tetanus counseled. Colonoscopy up to date. Counseled about sun safety and mole surveillance. Counseled about the dangers of distracted driving. Given 10 year screening recommendations.

## 2023-09-14 ENCOUNTER — Ambulatory Visit
Admission: RE | Admit: 2023-09-14 | Discharge: 2023-09-14 | Disposition: A | Discharge status: Home / Self Care | Source: Ambulatory Visit | Attending: Internal Medicine | Admitting: Internal Medicine

## 2023-09-14 DIAGNOSIS — K429 Umbilical hernia without obstruction or gangrene: Secondary | ICD-10-CM | POA: Diagnosis not present

## 2023-09-14 DIAGNOSIS — I700 Atherosclerosis of aorta: Secondary | ICD-10-CM | POA: Diagnosis not present

## 2023-09-14 DIAGNOSIS — K4090 Unilateral inguinal hernia, without obstruction or gangrene, not specified as recurrent: Secondary | ICD-10-CM

## 2023-09-14 DIAGNOSIS — K4020 Bilateral inguinal hernia, without obstruction or gangrene, not specified as recurrent: Secondary | ICD-10-CM | POA: Diagnosis not present

## 2023-09-15 ENCOUNTER — Encounter: Payer: Self-pay | Admitting: Internal Medicine

## 2023-09-16 ENCOUNTER — Other Ambulatory Visit: Payer: Self-pay

## 2023-09-22 ENCOUNTER — Other Ambulatory Visit: Payer: Self-pay | Admitting: Internal Medicine

## 2023-09-22 ENCOUNTER — Encounter: Payer: Self-pay | Admitting: Internal Medicine

## 2023-09-22 DIAGNOSIS — K4020 Bilateral inguinal hernia, without obstruction or gangrene, not specified as recurrent: Secondary | ICD-10-CM

## 2023-09-22 DIAGNOSIS — K429 Umbilical hernia without obstruction or gangrene: Secondary | ICD-10-CM

## 2023-10-06 DIAGNOSIS — K432 Incisional hernia without obstruction or gangrene: Secondary | ICD-10-CM | POA: Diagnosis not present

## 2023-10-06 DIAGNOSIS — K4020 Bilateral inguinal hernia, without obstruction or gangrene, not specified as recurrent: Secondary | ICD-10-CM | POA: Diagnosis not present

## 2023-10-16 ENCOUNTER — Encounter: Payer: Self-pay | Admitting: Internal Medicine

## 2023-10-20 NOTE — Telephone Encounter (Signed)
I have mailed this out to the patient.

## 2023-11-17 DIAGNOSIS — H40013 Open angle with borderline findings, low risk, bilateral: Secondary | ICD-10-CM | POA: Diagnosis not present

## 2023-11-18 DIAGNOSIS — L578 Other skin changes due to chronic exposure to nonionizing radiation: Secondary | ICD-10-CM | POA: Diagnosis not present

## 2023-11-18 DIAGNOSIS — L570 Actinic keratosis: Secondary | ICD-10-CM | POA: Diagnosis not present

## 2023-11-18 DIAGNOSIS — L719 Rosacea, unspecified: Secondary | ICD-10-CM | POA: Diagnosis not present

## 2023-11-18 DIAGNOSIS — L821 Other seborrheic keratosis: Secondary | ICD-10-CM | POA: Diagnosis not present

## 2023-11-18 DIAGNOSIS — L814 Other melanin hyperpigmentation: Secondary | ICD-10-CM | POA: Diagnosis not present

## 2023-11-18 DIAGNOSIS — L738 Other specified follicular disorders: Secondary | ICD-10-CM | POA: Diagnosis not present

## 2023-11-18 DIAGNOSIS — D485 Neoplasm of uncertain behavior of skin: Secondary | ICD-10-CM | POA: Diagnosis not present

## 2023-12-14 ENCOUNTER — Ambulatory Visit: Payer: Self-pay | Admitting: Surgery

## 2023-12-26 ENCOUNTER — Encounter: Payer: Self-pay | Admitting: Internal Medicine

## 2023-12-28 NOTE — Telephone Encounter (Signed)
**Note De-identified  [place] Obfuscation** Please advise 

## 2023-12-31 SURGERY — Surgical Case
Anesthesia: *Unknown

## 2024-01-14 NOTE — Patient Instructions (Signed)
 SURGICAL WAITING ROOM VISITATION  Patients having surgery or a procedure may have no more than 2 support people in the waiting area - these visitors may rotate.    Children under the age of 51 must have an adult with them who is not the patient.  Visitors with respiratory illnesses are discouraged from visiting and should remain at home.  If the patient needs to stay at the hospital during part of their recovery, the visitor guidelines for inpatient rooms apply. Pre-op nurse will coordinate an appropriate time for 1 support person to accompany patient in pre-op.  This support person may not rotate.    Please refer to the [facility] website for the visitor guidelines for Inpatients (after your surgery is over and you are in a regular room).       Your procedure is scheduled on: 01-25-24   Report to [facility] Main Entrance    Report to admitting at    10:45  AM   Call this number if you have problems the morning of surgery [phone]   Do not eat food :After Midnight.   After Midnight you may have the following liquids until __10:00____ AM/ DAY OF SURGERY   THEN NOTHING BY MOUTH  Water  Non-Citrus Juices (without pulp, NO RED-Apple, White grape, White cranberry) Black Coffee (NO MILK/CREAM OR CREAMERS, sugar ok)  Clear Tea (NO MILK/CREAM OR CREAMERS, sugar ok) regular and decaf                             Plain Jell-O (NO RED)                                           Fruit ices (not with fruit pulp, NO RED)                                     Popsicles (NO RED)                                                               Sports drinks like Gatorade (NO RED)                             If you have questions, please contact your surgeon's office.   FOLLOW B ANY ADDITIONAL PRE OP INSTRUCTIONS YOU RECEIVED FROM YOUR SURGEON'S OFFICE!!!     Oral Hygiene is also important to reduce your risk of infection.                                    Remember - BRUSH YOUR  TEETH THE MORNING OF SURGERY WITH YOUR REGULAR TOOTHPASTE  DENTURES WILL BE REMOVED PRIOR TO SURGERY PLEASE DO NOT APPLY Poly grip OR ADHESIVES!!!   Do NOT smoke after Midnight   Stop all vitamins and herbal supplements 7 days before surgery.   Take these medicines the morning of surgery with A SIP OF WATER : amlodipine , metoprolol ,  eye drops if needed  DO NOT TAKE ANY ORAL DIABETIC MEDICATIONS DAY OF YOUR SURGERY  Bring CPAP mask and tubing day of surgery.                              You may not have any metal on your body including hair pins, jewelry, and body piercing             Do not wear  lotions, powders, perfumes/cologne, or deodorant                Men may shave face and neck.   Do not bring valuables to the hospital. [facility] IS NOT             RESPONSIBLE   FOR VALUABLES.   Contacts, glasses, dentures or bridgework may not be worn into surgery.   Bring small overnight bag day of surgery.   DO NOT BRING YOUR HOME MEDICATIONS TO THE HOSPITAL. PHARMACY WILL DISPENSE MEDICATIONS LISTED ON YOUR MEDICATION LIST TO YOU DURING YOUR ADMISSION IN THE HOSPITAL!    Patients discharged on the day of surgery will not be allowed to drive home.  Someone NEEDS to stay with you for the first 24 hours after anesthesia.   Special Instructions: Bring a copy of your healthcare power of attorney and living will documents the day of surgery if you haven't scanned them before.              Please read over the following fact sheets you were given: IF YOU HAVE QUESTIONS ABOUT YOUR PRE-OP INSTRUCTIONS PLEASE CALL [phone].   If you test positive for Covid or have been in contact with anyone that has tested positive in the last 10 days please notify you surgeon.    [facility] - Preparing for Surgery Before surgery, you can play an important role.  Because skin is not sterile, your skin needs to be as free of germs as possible.  You can reduce the number of germs on your skin by washing  with CHG (chlorahexidine gluconate) soap before surgery.  CHG is an antiseptic cleaner which kills germs and bonds with the skin to continue killing germs even after washing. Please DO NOT use if you have an allergy to CHG or antibacterial soaps.  If your skin becomes reddened/irritated stop using the CHG and inform your nurse when you arrive at Short Stay. Do not shave (including legs and underarms) for at least 48 hours prior to the first CHG shower.  You may shave your face/neck. Please follow these instructions carefully:  1.  Shower with CHG Soap the night before surgery and the  morning of Surgery.  2.  If you choose to wash your hair, wash your hair first as usual with your  normal  shampoo.  3.  After you shampoo, rinse your hair and body thoroughly to remove the  shampoo.                           4.  Use CHG as you would any other liquid soap.  You can apply chg directly  to the skin and wash                       Gently with a scrungie or clean washcloth.  5.  Apply the CHG Soap to your body ONLY FROM THE NECK DOWN.  Do not use on face/ open                           Wound or open sores. Avoid contact with eyes, ears mouth and genitals (private parts).                       Wash face,  Genitals (private parts) with your normal soap.             6.  Wash thoroughly, paying special attention to the area where your surgery  will be performed.  7.  Thoroughly rinse your body with warm water  from the neck down.  8.  DO NOT shower/wash with your normal soap after using and rinsing off  the CHG Soap.                9.  Pat yourself dry with a clean towel.            10.  Wear clean pajamas.            11.  Place clean sheets on your bed the night of your first shower and do not  sleep with pets. Day of Surgery : Do not apply any lotions/deodorants the morning of surgery.  Please wear clean clothes to the hospital/surgery center.  FAILURE TO FOLLOW THESE INSTRUCTIONS MAY RESULT IN THE  CANCELLATION OF YOUR SURGERY PATIENT SIGNATURE_________________________________  NURSE SIGNATURE__________________________________  ________________________________________________________________________

## 2024-01-14 NOTE — Progress Notes (Signed)
 PCP - Almarie Cleveland, MD LOV 09-07-23 epic Cardiologist - no  PPM/ICD -  Device Orders -  Rep Notified -   Chest x-ray -  EKG - preop Stress Test -  ECHO -  Cardiac Cath -   Sleep Study - n/a CPAP -   Fasting Blood Sugar - n/a Checks Blood Sugar __n/a___ times a day  Blood Thinner Instructions:n/a Aspirin Instructions:n/a  ERAS Protcol -y PRE-SURGERY N/A    COVID vaccine -yes  Activity-- Able to climb a flight of stairs with no CP or SOB Anesthesia review: HTN  Patient denies shortness of breath, fever, cough and chest pain at PAT appointment   All instructions explained to the patient, with a verbal understanding of the material. Patient agrees to go over the instructions while at home for a better understanding. Patient also instructed to self quarantine after being tested for COVID-19. The opportunity to ask questions was provided.

## 2024-01-19 ENCOUNTER — Encounter (HOSPITAL_COMMUNITY): Payer: Self-pay

## 2024-01-19 ENCOUNTER — Encounter (HOSPITAL_COMMUNITY)
Admission: RE | Admit: 2024-01-19 | Discharge: 2024-01-19 | Disposition: A | Discharge status: Home / Self Care | Source: Ambulatory Visit | Attending: Surgery | Admitting: Surgery

## 2024-01-19 ENCOUNTER — Other Ambulatory Visit: Payer: Self-pay

## 2024-01-19 VITALS — BP 162/96 | HR 69 | Temp 98.5°F | Resp 16 | Ht 67.0 in | Wt 197.0 lb

## 2024-01-19 DIAGNOSIS — Z01818 Encounter for other preprocedural examination: Secondary | ICD-10-CM | POA: Diagnosis present

## 2024-01-19 DIAGNOSIS — I10 Essential (primary) hypertension: Secondary | ICD-10-CM | POA: Insufficient documentation

## 2024-01-19 LAB — BASIC METABOLIC PANEL WITH GFR
Anion gap: 9 (ref 5–15)
BUN: 22 mg/dL (ref 8–23)
CO2: 27 mmol/L (ref 22–32)
Calcium: 9.6 mg/dL (ref 8.9–10.3)
Chloride: 101 mmol/L (ref 98–111)
Creatinine, Ser: 1.04 mg/dL (ref 0.61–1.24)
GFR, Estimated: >60 mL/min (ref >60)
Glucose, Bld: 95 mg/dL (ref 70–99)
Potassium: 3.9 mmol/L (ref 3.5–5.1)
Sodium: 137 mmol/L (ref 135–145)

## 2024-01-19 LAB — CBC
HCT: 45.1 % (ref 39.0–52.0)
Hemoglobin: 14.6 g/dL (ref 13.0–17.0)
MCH: 29.4 pg (ref 26.0–34.0)
MCHC: 32.4 g/dL (ref 30.0–36.0)
MCV: 90.7 fL (ref 80.0–100.0)
Platelets: 270 K/uL (ref 150–400)
RBC: 4.97 MIL/uL (ref 4.22–5.81)
RDW: 13.6 % (ref 11.5–15.5)
WBC: 9.5 K/uL (ref 4.0–10.5)
nRBC: 0 % (ref 0.0–0.2)

## 2024-01-25 ENCOUNTER — Encounter (HOSPITAL_COMMUNITY): Payer: Self-pay | Admitting: Surgery

## 2024-01-25 ENCOUNTER — Ambulatory Visit (HOSPITAL_COMMUNITY)
Admission: RE | Admit: 2024-01-25 | Discharge: 2024-01-26 | Disposition: A | Discharge status: Home / Self Care | Source: Ambulatory Visit | Attending: Surgery | Admitting: Surgery

## 2024-01-25 ENCOUNTER — Ambulatory Visit (HOSPITAL_COMMUNITY): Payer: Self-pay | Admitting: Medical

## 2024-01-25 ENCOUNTER — Encounter (HOSPITAL_COMMUNITY)
Admission: RE | Disposition: A | Discharge status: Home / Self Care | Payer: Self-pay | Source: Ambulatory Visit | Attending: Surgery

## 2024-01-25 ENCOUNTER — Ambulatory Visit (HOSPITAL_BASED_OUTPATIENT_CLINIC_OR_DEPARTMENT_OTHER): Admitting: Anesthesiology

## 2024-01-25 DIAGNOSIS — I10 Essential (primary) hypertension: Secondary | ICD-10-CM | POA: Insufficient documentation

## 2024-01-25 DIAGNOSIS — K432 Incisional hernia without obstruction or gangrene: Secondary | ICD-10-CM

## 2024-01-25 DIAGNOSIS — K439 Ventral hernia without obstruction or gangrene: Secondary | ICD-10-CM | POA: Diagnosis present

## 2024-01-25 DIAGNOSIS — Z79899 Other long term (current) drug therapy: Secondary | ICD-10-CM | POA: Diagnosis not present

## 2024-01-25 DIAGNOSIS — K219 Gastro-esophageal reflux disease without esophagitis: Secondary | ICD-10-CM | POA: Diagnosis not present

## 2024-01-25 DIAGNOSIS — K4020 Bilateral inguinal hernia, without obstruction or gangrene, not specified as recurrent: Secondary | ICD-10-CM

## 2024-01-25 HISTORY — PX: XI ROBOTIC ASSISTED VENTRAL HERNIA: SHX6789

## 2024-01-25 LAB — CREATININE, SERUM
Creatinine, Ser: 0.85 mg/dL (ref 0.61–1.24)
GFR, Estimated: >60 mL/min (ref >60)

## 2024-01-25 LAB — CBC
HCT: 41.8 % (ref 39.0–52.0)
Hemoglobin: 13.3 g/dL (ref 13.0–17.0)
MCH: 29.5 pg (ref 26.0–34.0)
MCHC: 31.8 g/dL (ref 30.0–36.0)
MCV: 92.7 fL (ref 80.0–100.0)
Platelets: 263 K/uL (ref 150–400)
RBC: 4.51 MIL/uL (ref 4.22–5.81)
RDW: 13.2 % (ref 11.5–15.5)
WBC: 15.1 K/uL — ABNORMAL HIGH (ref 4.0–10.5)
nRBC: 0 % (ref 0.0–0.2)

## 2024-01-25 SURGERY — REPAIR, HERNIA, VENTRAL, ROBOT-ASSISTED
Anesthesia: General

## 2024-01-25 MED ORDER — FENTANYL CITRATE (PF) 100 MCG/2ML IJ SOLN
INTRAMUSCULAR | Status: AC
  Filled 2024-01-25: qty 2

## 2024-01-25 MED ORDER — ONDANSETRON HCL 4 MG/2ML IJ SOLN
INTRAMUSCULAR | Status: AC
  Filled 2024-01-25: qty 2

## 2024-01-25 MED ORDER — ONDANSETRON HCL 4 MG/2ML IJ SOLN
INTRAMUSCULAR | Status: DC | PRN
  Administered 2024-01-25: 4 mg via INTRAVENOUS

## 2024-01-25 MED ORDER — BUPIVACAINE-EPINEPHRINE 0.25% -1:200000 IJ SOLN
INTRAMUSCULAR | Status: DC | PRN
  Administered 2024-01-25: 30 mL

## 2024-01-25 MED ORDER — CHLORHEXIDINE GLUCONATE CLOTH 2 % EX PADS: 6.0000 | MEDICATED_PAD | Freq: Once | CUTANEOUS | Status: DC

## 2024-01-25 MED ORDER — LACTATED RINGERS IV SOLN: INTRAVENOUS | Status: DC

## 2024-01-25 MED ORDER — HYDROMORPHONE HCL 1 MG/ML IJ SOLN: 1.0000 mg | INTRAMUSCULAR | Status: DC | PRN

## 2024-01-25 MED ORDER — ORAL CARE MOUTH RINSE: 15.0000 mL | Freq: Once | OROMUCOSAL | Status: DC

## 2024-01-25 MED ORDER — PHENYLEPHRINE HCL-NACL 20-0.9 MG/250ML-% IV SOLN
INTRAVENOUS | Status: DC | PRN
  Administered 2024-01-25: 30 ug/min via INTRAVENOUS

## 2024-01-25 MED ORDER — OXYCODONE HCL 5 MG PO TABS: 10.0000 mg | ORAL_TABLET | ORAL | Status: DC | PRN

## 2024-01-25 MED ORDER — DOCUSATE SODIUM 100 MG PO CAPS
100.0000 mg | ORAL_CAPSULE | Freq: Two times a day (BID) | ORAL | Status: DC
  Administered 2024-01-25 – 2024-01-26 (×2): 100 mg via ORAL
  Filled 2024-01-25 (×2): qty 1

## 2024-01-25 MED ORDER — METHOCARBAMOL 1000 MG/10ML IJ SOLN: 500.0000 mg | Freq: Four times a day (QID) | INTRAMUSCULAR | Status: DC | PRN

## 2024-01-25 MED ORDER — DEXAMETHASONE SODIUM PHOSPHATE 10 MG/ML IJ SOLN
INTRAMUSCULAR | Status: AC
  Filled 2024-01-25: qty 1

## 2024-01-25 MED ORDER — KETOROLAC TROMETHAMINE 15 MG/ML IJ SOLN
15.0000 mg | Freq: Three times a day (TID) | INTRAMUSCULAR | Status: DC
  Administered 2024-01-25 – 2024-01-26 (×2): 15 mg via INTRAVENOUS
  Filled 2024-01-25 (×2): qty 1

## 2024-01-25 MED ORDER — LACTATED RINGERS IV SOLN
INTRAVENOUS | Status: DC | PRN
Start: 2024-01-25 — End: 2024-01-25

## 2024-01-25 MED ORDER — POLYVINYL ALCOHOL 1.4 % OP SOLN: 1.0000 [drp] | Freq: Every day | OPHTHALMIC | Status: DC | PRN

## 2024-01-25 MED ORDER — STERILE WATER FOR IRRIGATION IR SOLN
Status: DC | PRN
Start: 2024-01-25 — End: 2024-01-25
  Administered 2024-01-25: 1000 mL

## 2024-01-25 MED ORDER — GABAPENTIN 300 MG PO CAPS
300.0000 mg | ORAL_CAPSULE | ORAL | Status: AC
  Administered 2024-01-25: 300 mg via ORAL
  Filled 2024-01-25: qty 1

## 2024-01-25 MED ORDER — PHENYLEPHRINE HCL (PRESSORS) 10 MG/ML IV SOLN
INTRAVENOUS | Status: DC | PRN
  Administered 2024-01-25: 80 ug via INTRAVENOUS

## 2024-01-25 MED ORDER — GABAPENTIN 300 MG PO CAPS
300.0000 mg | ORAL_CAPSULE | Freq: Three times a day (TID) | ORAL | Status: DC
  Administered 2024-01-25 – 2024-01-26 (×2): 300 mg via ORAL
  Filled 2024-01-25 (×2): qty 3

## 2024-01-25 MED ORDER — CEFAZOLIN SODIUM-DEXTROSE 2-4 GM/100ML-% IV SOLN
2.0000 g | INTRAVENOUS | Status: AC
  Administered 2024-01-25: 2 g via INTRAVENOUS
  Filled 2024-01-25: qty 100

## 2024-01-25 MED ORDER — GLYCOPYRROLATE 0.2 MG/ML IJ SOLN
INTRAMUSCULAR | Status: DC | PRN
Start: 2024-01-25 — End: 2024-01-25
  Administered 2024-01-25: .2 mg via INTRAVENOUS

## 2024-01-25 MED ORDER — LIDOCAINE HCL (PF) 2 % IJ SOLN
INTRAMUSCULAR | Status: AC
Start: 2024-01-25 — End: 2024-01-25
  Filled 2024-01-25: qty 5

## 2024-01-25 MED ORDER — PHENYLEPHRINE 80 MCG/ML (10ML) SYRINGE FOR IV PUSH (FOR BLOOD PRESSURE SUPPORT)
PREFILLED_SYRINGE | INTRAVENOUS | Status: AC
Start: 2024-01-25 — End: 2024-01-25
  Filled 2024-01-25: qty 10

## 2024-01-25 MED ORDER — AMLODIPINE BESYLATE 5 MG PO TABS
5.0000 mg | ORAL_TABLET | Freq: Every day | ORAL | Status: DC
  Administered 2024-01-26: 5 mg via ORAL
  Filled 2024-01-25: qty 1

## 2024-01-25 MED ORDER — 0.9 % SODIUM CHLORIDE (POUR BTL) OPTIME
TOPICAL | Status: DC | PRN
  Administered 2024-01-25: 1000 mL

## 2024-01-25 MED ORDER — PROCHLORPERAZINE EDISYLATE 10 MG/2ML IJ SOLN: 10.0000 mg | INTRAMUSCULAR | Status: DC | PRN

## 2024-01-25 MED ORDER — ROCURONIUM BROMIDE 10 MG/ML (PF) SYRINGE
PREFILLED_SYRINGE | INTRAVENOUS | Status: AC
  Filled 2024-01-25: qty 10

## 2024-01-25 MED ORDER — HEPARIN SODIUM (PORCINE) 5000 UNIT/ML IJ SOLN
5000.0000 [IU] | Freq: Once | INTRAMUSCULAR | Status: AC
  Administered 2024-01-25: 5000 [IU] via SUBCUTANEOUS
  Filled 2024-01-25: qty 1

## 2024-01-25 MED ORDER — PROPOFOL 10 MG/ML IV BOLUS
INTRAVENOUS | Status: AC
  Filled 2024-01-25: qty 20

## 2024-01-25 MED ORDER — OXYCODONE HCL 5 MG PO TABS: 5.0000 mg | ORAL_TABLET | Freq: Once | ORAL | Status: DC | PRN

## 2024-01-25 MED ORDER — SIMETHICONE 80 MG PO CHEW
80.0000 mg | CHEWABLE_TABLET | Freq: Four times a day (QID) | ORAL | Status: DC | PRN
  Administered 2024-01-25: 80 mg via ORAL
  Filled 2024-01-25: qty 1

## 2024-01-25 MED ORDER — FENTANYL CITRATE (PF) 100 MCG/2ML IJ SOLN
INTRAMUSCULAR | Status: DC | PRN
  Administered 2024-01-25: 50 ug via INTRAVENOUS
  Administered 2024-01-25: 100 ug via INTRAVENOUS

## 2024-01-25 MED ORDER — FENTANYL CITRATE PF 50 MCG/ML IJ SOSY
PREFILLED_SYRINGE | INTRAMUSCULAR | Status: AC
  Filled 2024-01-25: qty 3

## 2024-01-25 MED ORDER — OXYCODONE HCL 5 MG PO TABS: 5.0000 mg | ORAL_TABLET | ORAL | Status: DC | PRN

## 2024-01-25 MED ORDER — SUGAMMADEX SODIUM 200 MG/2ML IV SOLN
INTRAVENOUS | Status: AC
  Filled 2024-01-25: qty 2

## 2024-01-25 MED ORDER — PROPYLENE GLYCOL (PF) 0.6 % OP SOLN: 1.0000 [drp] | Freq: Every day | OPHTHALMIC | Status: DC | PRN

## 2024-01-25 MED ORDER — DROPERIDOL 2.5 MG/ML IJ SOLN: 0.6250 mg | Freq: Once | INTRAMUSCULAR | Status: DC | PRN

## 2024-01-25 MED ORDER — METOPROLOL SUCCINATE ER 50 MG PO TB24
50.0000 mg | ORAL_TABLET | Freq: Every day | ORAL | Status: DC
  Administered 2024-01-26: 50 mg via ORAL
  Filled 2024-01-25: qty 1

## 2024-01-25 MED ORDER — PROPOFOL 10 MG/ML IV BOLUS
INTRAVENOUS | Status: DC | PRN
  Administered 2024-01-25: 170 mg via INTRAVENOUS
  Administered 2024-01-25: 30 mg via INTRAVENOUS

## 2024-01-25 MED ORDER — SUGAMMADEX SODIUM 200 MG/2ML IV SOLN
INTRAVENOUS | Status: DC | PRN
  Administered 2024-01-25: 200 mg via INTRAVENOUS

## 2024-01-25 MED ORDER — BUPIVACAINE-EPINEPHRINE (PF) 0.25% -1:200000 IJ SOLN
INTRAMUSCULAR | Status: AC
  Filled 2024-01-25: qty 60

## 2024-01-25 MED ORDER — CHLORHEXIDINE GLUCONATE 0.12 % MT SOLN: 15.0000 mL | Freq: Once | OROMUCOSAL | Status: DC

## 2024-01-25 MED ORDER — ONDANSETRON HCL 4 MG/2ML IJ SOLN: 4.0000 mg | Freq: Four times a day (QID) | INTRAMUSCULAR | Status: DC | PRN

## 2024-01-25 MED ORDER — LIDOCAINE HCL (CARDIAC) PF 100 MG/5ML IV SOSY
PREFILLED_SYRINGE | INTRAVENOUS | Status: DC | PRN
Start: 2024-01-25 — End: 2024-01-25
  Administered 2024-01-25: 100 mg via INTRAVENOUS

## 2024-01-25 MED ORDER — DEXAMETHASONE SODIUM PHOSPHATE 10 MG/ML IJ SOLN
INTRAMUSCULAR | Status: DC | PRN
Start: 2024-01-25 — End: 2024-01-25
  Administered 2024-01-25: 8 mg via INTRAVENOUS

## 2024-01-25 MED ORDER — ACETAMINOPHEN 325 MG PO TABS
650.0000 mg | ORAL_TABLET | Freq: Four times a day (QID) | ORAL | Status: DC
  Administered 2024-01-25 – 2024-01-26 (×2): 650 mg via ORAL
  Filled 2024-01-25 (×3): qty 2

## 2024-01-25 MED ORDER — PHENYLEPHRINE HCL (PRESSORS) 10 MG/ML IV SOLN
INTRAVENOUS | Status: AC
  Filled 2024-01-25: qty 1

## 2024-01-25 MED ORDER — ACETAMINOPHEN 500 MG PO TABS
1000.0000 mg | ORAL_TABLET | ORAL | Status: AC
  Administered 2024-01-25: 1000 mg via ORAL
  Filled 2024-01-25: qty 2

## 2024-01-25 MED ORDER — FENTANYL CITRATE PF 50 MCG/ML IJ SOSY
25.0000 ug | PREFILLED_SYRINGE | INTRAMUSCULAR | Status: DC | PRN
  Administered 2024-01-25 (×2): 50 ug via INTRAVENOUS

## 2024-01-25 MED ORDER — OXYCODONE HCL 5 MG/5ML PO SOLN: 5.0000 mg | Freq: Once | ORAL | Status: DC | PRN

## 2024-01-25 MED ORDER — ENOXAPARIN SODIUM 40 MG/0.4ML IJ SOSY: 40.0000 mg | PREFILLED_SYRINGE | INTRAMUSCULAR | Status: DC

## 2024-01-25 MED ORDER — ROCURONIUM BROMIDE 100 MG/10ML IV SOLN
INTRAVENOUS | Status: DC | PRN
  Administered 2024-01-25: 60 mg via INTRAVENOUS
  Administered 2024-01-25 (×3): 20 mg via INTRAVENOUS

## 2024-01-25 SURGICAL SUPPLY — 57 items
BAG COUNTER SPONGE SURGICOUNT (BAG) ×2 IMPLANT
BLADE SURG SZ11 CARB STEEL (BLADE) ×2 IMPLANT
CHLORAPREP W/TINT 26 (MISCELLANEOUS) ×2 IMPLANT
COVER MAYO STAND STRL (DRAPES) ×2 IMPLANT
COVER SURGICAL LIGHT HANDLE (MISCELLANEOUS) ×2 IMPLANT
COVER TIP SHEARS 8 DVNC (MISCELLANEOUS) ×2 IMPLANT
DERMABOND ADVANCED .7 DNX12 (GAUZE/BANDAGES/DRESSINGS) IMPLANT
DERMABOND ADVANCED .7 DNX6 (GAUZE/BANDAGES/DRESSINGS) IMPLANT
DEVICE TROCAR PUNCTURE CLOSURE (ENDOMECHANICALS) IMPLANT
DRAPE ARM DVNC X/XI (DISPOSABLE) ×6 IMPLANT
DRAPE COLUMN DVNC XI (DISPOSABLE) ×2 IMPLANT
DRIVER NDL LRG 8 DVNC XI (INSTRUMENTS) ×4 IMPLANT
DRIVER NDL MEGA SUTCUT DVNCXI (INSTRUMENTS) ×4 IMPLANT
DRIVER NDLE LRG 8 DVNC XI (INSTRUMENTS) ×1 IMPLANT
DRIVER NDLE MEGA SUTCUT DVNCXI (INSTRUMENTS) ×1 IMPLANT
ELECT PENCIL ROCKER SW 15FT (MISCELLANEOUS) ×2 IMPLANT
ELECT REM PT RETURN 15FT ADLT (MISCELLANEOUS) ×2 IMPLANT
ELECTRODE L-HOOK LAP 45CM DISP (ELECTROSURGICAL) ×2 IMPLANT
FORCEPS PROGRASP DVNC XI (FORCEP) ×2 IMPLANT
GLOVE BIO SURGEON STRL SZ7.5 (GLOVE) ×4 IMPLANT
GLOVE INDICATOR 8.0 STRL GRN (GLOVE) ×4 IMPLANT
GOWN STRL REUS W/ TWL XL LVL3 (GOWN DISPOSABLE) ×4 IMPLANT
GRASPER SUT TROCAR 14GX15 (MISCELLANEOUS) IMPLANT
GRASPER TIP-UP FEN DVNC XI (INSTRUMENTS) ×2 IMPLANT
IRRIGATION SUCT STRKRFLW 2 WTP (MISCELLANEOUS) IMPLANT
KIT BASIN OR (CUSTOM PROCEDURE TRAY) ×2 IMPLANT
KIT TURNOVER KIT A (KITS) ×2 IMPLANT
MANIFOLD NEPTUNE II (INSTRUMENTS) ×2 IMPLANT
MARKER SKIN DUAL TIP RULER LAB (MISCELLANEOUS) ×2 IMPLANT
MESH SOFT 12X12IN BARD (Mesh General) IMPLANT
NDL SPNL 18GX3.5 QUINCKE PK (NEEDLE) ×2 IMPLANT
NEEDLE SPNL 18GX3.5 QUINCKE PK (NEEDLE) ×1 IMPLANT
OBTURATOR OPTICALSTD 8 DVNC (TROCAR) ×2 IMPLANT
PACK CARDIOVASCULAR III (CUSTOM PROCEDURE TRAY) ×2 IMPLANT
SCISSORS LAP 5X35 DISP (ENDOMECHANICALS) IMPLANT
SCISSORS MNPLR CVD DVNC XI (INSTRUMENTS) ×2 IMPLANT
SEAL UNIV 5-12 XI (MISCELLANEOUS) ×6 IMPLANT
SET TUBE SMOKE EVAC HIGH FLOW (TUBING) ×2 IMPLANT
SLEEVE Z-THREAD 5X100MM (TROCAR) IMPLANT
SOLUTION ANTFG W/FOAM PAD STRL (MISCELLANEOUS) ×2 IMPLANT
SOLUTION ELECTROSURG ANTI STCK (MISCELLANEOUS) ×2 IMPLANT
SPIKE FLUID TRANSFER (MISCELLANEOUS) ×2 IMPLANT
SPONGE T-LAP 18X18 ~~LOC~~+RFID (SPONGE) ×2 IMPLANT
SUT ETHIBOND 0 36 GRN (SUTURE) IMPLANT
SUT MNCRL AB 4-0 PS2 18 (SUTURE) ×2 IMPLANT
SUT STRAFIX PDS 18 CTX (SUTURE) IMPLANT
SUT STRATA PDS 0 15 CT-1.5 (SUTURE) IMPLANT
SUT VIC AB 2-0 SH 27XBRD (SUTURE) IMPLANT
SUT VLOC 180 0 9IN GS21 (SUTURE) IMPLANT
SUTURE STRAFIX SYMMETRC 1-0 12 (SUTURE) IMPLANT
SUTURE STRAFIX SYMMETRC 1-0 24 (SUTURE) IMPLANT
SUTURE STRTFX SPRL PDS+ 2-0 23 (SUTURE) IMPLANT
SYR 20ML LL LF (SYRINGE) ×2 IMPLANT
TAPE STRIPS DRAPE STRL (GAUZE/BANDAGES/DRESSINGS) ×2 IMPLANT
TOWEL OR 17X26 10 PK STRL BLUE (TOWEL DISPOSABLE) ×2 IMPLANT
TROCAR ADV FIXATION 12X100MM (TROCAR) ×2 IMPLANT
TROCAR Z-THREAD FIOS 5X100MM (TROCAR) ×2 IMPLANT

## 2024-01-25 NOTE — Anesthesia Preprocedure Evaluation (Addendum)
 Anesthesia Evaluation  Patient identified by MRN, date of birth, ID band Patient awake    Reviewed: Allergy & Precautions, NPO status , Patient's Chart, lab work & pertinent test results, reviewed documented beta blocker date and time   History of Anesthesia Complications Negative for: history of anesthetic complications  Airway Mallampati: II  TM Distance: >3 FB Neck ROM: Full    Dental no notable dental hx.    Pulmonary neg pulmonary ROS   Pulmonary exam normal        Cardiovascular hypertension, Pt. on medications and Pt. on home beta blockers Normal cardiovascular exam     Neuro/Psych negative neurological ROS     GI/Hepatic Neg liver ROS,GERD  Medicated and Controlled,,BILATERAL INGUINAL AND SUPRAUMBILICAL INCISIONAL HERNIA   Endo/Other  negative endocrine ROS    Renal/GU negative Renal ROS     Musculoskeletal negative musculoskeletal ROS (+)    Abdominal   Peds  Hematology negative hematology ROS (+)   Anesthesia Other Findings Day of surgery medications reviewed with patient.  Reproductive/Obstetrics                              Anesthesia Physical Anesthesia Plan  ASA: 2  Anesthesia Plan: General   Post-op Pain Management: Tylenol  PO (pre-op)*   Induction: Intravenous  PONV Risk Score and Plan: 2 and Treatment may vary due to age or medical condition, Ondansetron  and Dexamethasone   Airway Management Planned: Oral ETT  Additional Equipment: None  Intra-op Plan:   Post-operative Plan: Extubation in OR  Informed Consent: I have reviewed the patients History and Physical, chart, labs and discussed the procedure including the risks, benefits and alternatives for the proposed anesthesia with the patient or authorized representative who has indicated his/her understanding and acceptance.     Dental advisory given  Plan Discussed with: CRNA  Anesthesia Plan Comments:           Anesthesia Quick Evaluation

## 2024-01-25 NOTE — Anesthesia Procedure Notes (Signed)
 Procedure Name: Intubation Date/Time: 01/25/2024 2:48 PM  Performed by: Obadiah Reyes BROCKS, CRNAPre-anesthesia Checklist: Patient identified, Emergency Drugs available, Suction available and Patient being monitored Patient Re-evaluated:Patient Re-evaluated prior to induction Oxygen Delivery Method: Circle System Utilized Preoxygenation: Pre-oxygenation with 100% oxygen Induction Type: IV induction Ventilation: Mask ventilation without difficulty Laryngoscope Size: Miller and 2 Grade View: Grade I Tube type: Oral Tube size: 7.5 mm Number of attempts: 1 Airway Equipment and Method: Stylet and Oral airway Placement Confirmation: ETT inserted through vocal cords under direct vision, positive ETCO2 and breath sounds checked- equal and bilateral Secured at: 23 cm Tube secured with: Tape Dental Injury: Teeth and Oropharynx as per pre-operative assessment

## 2024-01-25 NOTE — Transfer of Care (Signed)
 Immediate Anesthesia Transfer of Care Note  Patient: Albert Walls  Procedure(s) Performed: REPAIR, HERNIA, VENTRAL, ROBOT-ASSISTED, BILATERAL INGUINAL HERNIA REPAIR WITH MESH, BILATERAL POSTERIOR RECTUS MYOFASCIAL RELEASE, BILATERAL TRANSVERSUS ABDOMINIS RELEASE  Patient Location: PACU  Anesthesia Type:General  Level of Consciousness: awake and pateint uncooperative  Airway & Oxygen Therapy: Patient Spontanous Breathing and Patient connected to face mask oxygen  Post-op Assessment: Report given to RN and Post -op Vital signs reviewed and stable  Post vital signs: Reviewed and stable  Last Vitals:  Vitals Value Taken Time  BP    Temp    Pulse 85 01/25/24 18:45  Resp    SpO2 96 % 01/25/24 18:45  Vitals shown include unfiled device data.  Last Pain:  Vitals:   01/25/24 1117  TempSrc:   PainSc: 0-No pain         Complications: No notable events documented.

## 2024-01-25 NOTE — H&P (Signed)
 Admitting Physician: Deward PARAS Ryott Rafferty  Service: General surgery  CC: Hernia  Subjective   HPI: Albert Walls is an 73 y.o. male who is here for robotic hernia repair  Past Medical History:  Diagnosis Date   Cancer [facility])    prostate   Community acquired pneumonia 06/02/1975   lingula   GERD (gastroesophageal reflux disease)    hx of- diet controlled   HTN (hypertension)    on meds   Hyperlipidemia    diet controlled    Past Surgical History:  Procedure Laterality Date   COLONOSCOPY  2013   Dr. Kaplan-suprep(exc)normal -10 yr recall   LYMPHADENECTOMY Bilateral 11/09/2022   Procedure: BILATERAL PELVIC LYMPHADENECTOMY;  Surgeon: Renda Glance, MD;  Location: [facility] OR[facility];  Service: Urology;  Laterality: Bilateral;   ROBOT ASSISTED LAPAROSCOPIC RADICAL PROSTATECTOMY N/A 11/09/2022   Procedure: XI ROBOTIC ASSISTED LAPAROSCOPIC RADICAL PROSTATECTOMY LEVEL 2;  Surgeon: Renda Glance, MD;  Location: [facility];  Service: Urology;  Laterality: N/A;  210 MINUTES NEEDED FOR CASE   TONSILLECTOMY AND ADENOIDECTOMY  1956   UMBILICAL HERNIA REPAIR N/A 11/09/2022   Procedure: HERNIA REPAIR UMBILICAL ADULT;  Surgeon: Renda Glance, MD;  Location: [facility];  Service: Urology;  Laterality: N/A;   UPPER GASTROINTESTINAL ENDOSCOPY     gastritis   VASECTOMY  06/01/1985    Family History  Problem Relation Age of Onset   Stroke Paternal Aunt 14   Testicular cancer Paternal Uncle    Diabetes Unknown        paternal great uncle   Colon cancer Other        PGGF   Colon cancer Other        Paternal great uncle    Colon cancer Maternal Grandfather    Dementia Mother    Heart disease Neg Hx     Social:  reports that he has never smoked. He has never been exposed to tobacco smoke. He has never used smokeless tobacco. He reports current alcohol  use of about 9.0 - 12.0 standard drinks of alcohol  per week. He reports that he does not use drugs.  Allergies: No Known  Allergies  Medications: Current Outpatient Medications  Medication Instructions   amLODipine  (NORVASC ) 5 mg, Oral, Daily   calcium carbonate (TUMS - DOSED IN MG ELEMENTAL CALCIUM) 500 MG chewable tablet 2 tablets, Daily PRN   metoprolol  succinate (TOPROL -XL) 50 mg, Oral, Daily   PRESCRIPTION MEDICATION 1 Application, Daily   Propylene Glycol, PF, (SYSTANE COMPLETE PF) 0.6 % SOLN 1 drop, Daily PRN    ROS - all of the below systems have been reviewed with the patient and positives are indicated with bold text General: chills, fever or night sweats Eyes: blurry vision or double vision ENT: epistaxis or sore throat Allergy/Immunology: itchy/watery eyes or nasal congestion Hematologic/Lymphatic: bleeding problems, blood clots or swollen lymph nodes Endocrine: temperature intolerance or unexpected weight changes Breast: new or changing breast lumps or nipple discharge Resp: cough, shortness of breath, or wheezing CV: chest pain or dyspnea on exertion GI: as per HPI GU: dysuria, trouble voiding, or hematuria MSK: joint pain or joint stiffness Neuro: TIA or stroke symptoms Derm: pruritus and skin lesion changes Psych: anxiety and depression  Objective   PE There were no vitals taken for this visit. Constitutional: NAD; conversant; no deformities Eyes: Moist conjunctiva; no lid lag; anicteric; PERRL Neck: Trachea midline; no thyromegaly Lungs: Normal respiratory effort; no tactile fremitus CV: RRR; no palpable thrills; no pitting edema GI: Abd reducible supraumbilical  hernia, reducible large right inguinal hernia, small reducible left inguinal hernia MSK: Normal range of motion of extremities; no clubbing/cyanosis Psychiatric: Appropriate affect; alert and oriented x3 Lymphatic: No palpable cervical or axillary lymphadenopathy  No results found for this or any previous visit (from the past 24 hours).  Imaging Orders  No imaging studies ordered today   CT abd/pel 09/14/23  1.8  cm wide by 1.7 cm tall supraumbilical hernia  Small right and medium left bilateral inguinal hernias containing fat only  Assessment and Plan:   Diagnoses and all orders for this visit:  Incisional hernia, without obstruction or gangrene  Non-recurrent bilateral inguinal hernia without obstruction or gangrene   Assessment and Plan   Albert Walls is an 73 y.o. male with hernias.  I recommend robotic incisional hernia repair and bilateral inguinal hernia repair with mesh and bilateral posterior rectus myofascial release, possible bilateral transversus abdominis release. We discussed the procedure, its risks, benefits and alternatives and the patient granted consent to proceed.  Deward JINNY Foy, MD  [facility] Use [email or web address] to contact on call provider

## 2024-01-25 NOTE — Anesthesia Postprocedure Evaluation (Signed)
 Anesthesia Post Note  Patient: Albert Walls  Procedure(s) Performed: REPAIR, HERNIA, VENTRAL, ROBOT-ASSISTED, BILATERAL INGUINAL HERNIA REPAIR WITH MESH, BILATERAL POSTERIOR RECTUS MYOFASCIAL RELEASE, BILATERAL TRANSVERSUS ABDOMINIS RELEASE     Patient location during evaluation: PACU Anesthesia Type: General Level of consciousness: awake and alert Pain management: pain level controlled Vital Signs Assessment: post-procedure vital signs reviewed and stable Respiratory status: spontaneous breathing, nonlabored ventilation and respiratory function stable Cardiovascular status: blood pressure returned to baseline Postop Assessment: no apparent nausea or vomiting Anesthetic complications: no   No notable events documented.  Last Vitals:  Vitals:   01/25/24 1115 01/25/24 1845  BP: (!) 159/93 121/87  Pulse: 79 85  Resp: 18 13  Temp: 36.8 C (!) 36.1 C  SpO2: 97% 95%                Vertell Row

## 2024-01-25 NOTE — Op Note (Signed)
 Patient: Albert Walls (Nov 10, 1950, [id number])  Date of Surgery: 01/25/2024  Preoperative Diagnosis: BILATERAL INGUINAL AND SUPRAUMBILICAL INCISIONAL HERNIA (1.8 cm wide by 1.7 cm tall on preoperative CT)  Postoperative Diagnosis: BILATERAL INGUINAL AND SUPRAUMBILICAL INCISIONAL HERNIA  (1.8 cm wide by 1.7 cm tall on preoperative CT)  Surgical Procedure:  ROBOTIC VENTRAL HERNIA REPAIR WITH MESH ROBOTIC BILATERAL INGUINAL HERNIA REPAIR WITH MESH BILATERAL POSTERIOR RECTUS MYOFASCIAL RELEASE BILATERAL TRANSVERSUS ABDOMINIS MYOFASCIAL RELEASE  Operative Team Members:  Surgeons and Role:    * Dacotah Cabello, Deward PARAS, MD - Primary   Anesthesiologist: Ayen Viviano Lamarr BRAVO, MD CRNA: Obadiah Reyes BROCKS, CRNA; Lyttle, Justin C, CRNA   Anesthesia: General   Fluids:  Total I/O In: 1000 [I.V.:1000] Out: 300 [Urine:300]  Complications: * No complications entered in OR log *  Drains:  None  Specimen: * No specimens in log *   Disposition:  PACU - hemodynamically stable.  Plan of Care: Discharge to home after PACU  Indications for Procedure:   Albert Walls is an 73 y.o. male with hernias.  I recommend robotic incisional hernia repair and bilateral inguinal hernia repair with mesh and bilateral posterior rectus myofascial release, possible bilateral transversus abdominis release. We discussed the procedure, its risks, benefits and alternatives and the patient granted consent to proceed.   Findings:  Hernia Location: Ventral hernia location: Umbilical (M3) Hernia Size:  1.8 cm wide by 1.7 cm tall on preoperative CT  Mesh Size &Type:  33 cm tall by 27 cm wide Bard Soft Mesh Mesh Position: Sublay - Retromuscular Myofascial Releases: Bilateral posterior rectus myofascial release, Bilateral transversus abdominis myofascial release   Description of Procedure: The patient was positioned supine, moderately flexed at the umbilical level, padded and secured on the operating table.  A  timeout procedure was performed.    What is described is a robotic, totally extraperitoneal retromuscular incisional hernia repair with bilateral rectus myofascial release, bilateral transversus abdominis myofascial release and retromuscular mesh placement.  Laparoscopic Portion: The retrorectus space was entered in the LEFT hypochondrium, at approximately the midclavicular line utilizing a 5 mm optical-viewing trocar.  Upon safe entry into this space, it was insufflated while performing a blunt dissection with the camera still in the optical trocar.   A rectus myofascial release was initiated laparoscopically on the LEFT side. Dissection was carried out laterally in the retromuscular plane to the edge of the rectus sheath progressively disconnecting the rectus muscle from the underlying posterior rectus sheath. Both the segmental innervation as well as the intercostal artery and vein brances to the rectus muscle were individually preserved.    During the left sided retrorectus dissection, a 12 mm trocar was placed into the lateral most edge of the retrorectus space.  With these initial trocars in position, the medial most aspect of the retrorectus plane was identified, and the posterior sheath was visualized as it inserted on the linea alba. The posterior sheath was incised with cautery entering the preperitoneal plane. A crossover was performed dissecting under the linea alba in the preperitoneal plane until the right rectus sheath was identified.  After identification of the right rectus sheath, it was incised vertically to enter the retrorectus space on the right.   A rectus myofascial release was initiated laparoscopically on the RIGHT side.  Blunt dissection was carried out laterally in the retromuscular plane to the edge of the rectus sheath progressively disconnecting the rectus muscle from the underlying posterior rectus sheath. Both the segmental innervation as well as the  intercostal artery  and vein brances to the rectus muscle were individually preserved.   At this juncture, both retrorectus planes were initially connected to each other and there was space for further trocar placement. An 8 mm robotic trocar was placed in the midclavicular line in right retrorectus space.  A 8mm robotic trocar was placed within the left rectus musculature in the upper abdomen, and not through the linea alba.  The initial 5 mm access trocar in the midclavicular line within the left retrorectus space was switched out for an 8 mm robotic trocar.   Robotic Portion: The Intuitive daVinci Xi surgical robot was docked in the standard fashion and the procedure begun from the robotic console. A Prograsp instrument and monopolar shears were used for the dissection.  The rectus myofascial release was completed robotically on the RIGHT side.  Dissection was carried down inferiorly preserving the peritoneum and the preperitoneal fat in the midline as it was gently dissected off of the overlying linea alba.  On the right side, the posterior rectus sheath was progressively disconnected from its insertion on the linea alba. This allowed for progression of the right side rectus myofascial release.  The rectus myofascial release accomplished medialization of the posterior rectus sheath towards the midline and disinsertion of the rectus muscle from its surrounding fascia, and thus its encasement in the rectus sheath, allowing for widening of the rectus muscle and transfer of the rectus flap towards the midline.  This will allow for future inset of the medial aspect of the flap for abdominal wall reconstruction.  Similarly, the rectus myofascial release was completed robotically on the LEFT side.  Dissection of the posterior rectus sheath was also progressively disconnected from its insertion on the linea alba.  This allowed for progression of the left side rectus myofascial release.  The rectus myofascial release accomplished  medialization of the posterior rectus sheath towards the midline and disinsertion of the rectus muscle from its surrounding fascia, and thus its encasement in the rectus sheath, allowing for widening of the rectus muscle and transfer of the rectus flap towards the midline.  This will allow for future inset of the medial aspect of the flap for abdominal wall reconstruction.  During the dissection of the midline the hernia defect was identified and the hernia sac was not reducible, therefore the hernia peritoneum was incised circumferentially around the edge of the hernia defect which left a defect within the peritoneum in the midline.  This defect was later closed with a running 2-0 Ethicon Stratafix Spiral PDS suture.  Both the left and the right rectus myofascial releases were performed towards the lower abdomen, past the arcuate line bilaterally.  During this dissection, the peritoneum and preperitoneal fat in the midline were further preserved below the hernia as they were dissected off of the overlying linea alba.   The hernia defect area was now visualized fully.  Tension on the peritoneal and posterior rectus fascia closure was assessed.  The goal was for a tension free closure to avoid postoperative intraparietal hernia, and sufficient retromuscular mesh overlap.  There was significant scar to the midline from previous incision so the peritoneal defect was large.  Also, to expose the inguinal hernias for sufficient mesh overlap, addditional lateral coverage was needed due to the narrow rectus muscles. To acchieve these goals, I decided bilateral transversus abdominis release  was necessary.    A transversus abdominis release (TAR) was performed on the RIGHT side.  The transversus abdominis muscle was identified  deep to the posterior rectus sheath and incised vertically along its entire length, entering the pre-peritoneal or pre-transversalis fascia plane.  This disinserted the transversus abdominis  muscle from the linea semilunaris.  Since the intercostal nerves, arteries and veins had been preserved during the rectus myofascial release portion of the procedure, they remained intact during the TAR. The peritoneum was subsequently peeled away from the underside of the divided transversus abdominis muscle.  This dissection was carried out laterally towards the retroperitoneum.  The TAR accomplished additional medialization of the posterior rectus sheath with its attached peritoneum towards the midline to allow for visceral sac closure.  The TAR also provided further offset of tension of the rectus muscle flap with additional transfer of the rectus muscle towards the midline, as it remained attached to the external and internal abdominal oblique muscles.  This will allow for future inset of the medial aspect of the flap for abdominal wall reconstruction.   A transversus abdominis release (TAR) was performed on the LEFT side.  The transversus abdominis muscle was identified deep to the posterior rectus sheath and incised vertically along its entire length, entering the pre-peritoneal or pre-transversalis fascia plane.  This disinserted the transversus abdominis muscle from the linea semilunaris.  Since the intercostal nerves, arteries and veins had been preserved during the rectus myofascial release portion of the procedure, they remained intact during the TAR. The peritoneum was subsequently peeled away from the underside of the divided transversus abdominis muscle.  This dissection was carried out laterally towards the retroperitoneum.  The TAR accomplished additional medialization of the posterior rectus sheath with its attached peritoneum towards the midline to allow for visceral sac closure.  The TAR also provided further offset of tension of the rectus muscle flap with additional transfer of the rectus muscle towards the midline, as it remained attached to the external and internal abdominal oblique  muscles.  This will allow for future inset of the medial aspect of the flap for abdominal wall reconstruction.   The hernia defect was closed utilizing a continuous, #1 Ethicon Stratafix Symmetric PDS Plus suture.  The hernia defect, and subsequently the rectus musculature, came together well for a complete abdominal wall reconstruction.  The dissected out retrorectus space was measured with a metric ruler so as to determine the size of the proposed mesh.    The robot was undocked and the laparoscope was inserted, inspecting for hemostasis.  The mesh deployment was performed laparoscopically.  Laparoscopic Portion:  A transversus abdominis plane (TAP) block was performed bilaterally with marcaine .  The anesthetic was first injected into the plane between the transversus abdominis and internal abdominal oblique muscles on the left. The TAP was repeated on the contralateral side.   A piece of Bard Soft mesh was opened and trimmed to 27 cm wide x 33 cm tall. The mesh was advanced into the retrorectus space and the mesh positioned flat against the intact posterior rectus sheaths. The mesh was fixated to coopers ligament using ethibond sutures.  It was fixed laterally using vicryl suture to ensure no recurrent inguinal hernias.  It occupied the entire retromuscular plane, and also covered all of the trocars.  The trocars were removed and the skin closed with 4-0 Monocryl subcuticular sutures and skin glue.   Deward Foy, MD General, Bariatric, & Minimally Invasive Surgery [facility], [place]

## 2024-01-26 ENCOUNTER — Encounter (HOSPITAL_COMMUNITY): Payer: Self-pay | Admitting: Surgery

## 2024-01-26 ENCOUNTER — Other Ambulatory Visit: Payer: Self-pay

## 2024-01-26 DIAGNOSIS — K4020 Bilateral inguinal hernia, without obstruction or gangrene, not specified as recurrent: Secondary | ICD-10-CM | POA: Diagnosis not present

## 2024-01-26 LAB — CBC
HCT: 37.2 % — ABNORMAL LOW (ref 39.0–52.0)
Hemoglobin: 12.3 g/dL — ABNORMAL LOW (ref 13.0–17.0)
MCH: 30.4 pg (ref 26.0–34.0)
MCHC: 33.1 g/dL (ref 30.0–36.0)
MCV: 92.1 fL (ref 80.0–100.0)
Platelets: 244 K/uL (ref 150–400)
RBC: 4.04 MIL/uL — ABNORMAL LOW (ref 4.22–5.81)
RDW: 13.3 % (ref 11.5–15.5)
WBC: 12.1 K/uL — ABNORMAL HIGH (ref 4.0–10.5)
nRBC: 0 % (ref 0.0–0.2)

## 2024-01-26 LAB — BASIC METABOLIC PANEL WITH GFR
Anion gap: 11 (ref 5–15)
BUN: 17 mg/dL (ref 8–23)
CO2: 25 mmol/L (ref 22–32)
Calcium: 8.7 mg/dL — ABNORMAL LOW (ref 8.9–10.3)
Chloride: 103 mmol/L (ref 98–111)
Creatinine, Ser: 0.96 mg/dL (ref 0.61–1.24)
GFR, Estimated: >60 mL/min (ref >60)
Glucose, Bld: 117 mg/dL — ABNORMAL HIGH (ref 70–99)
Potassium: 4.7 mmol/L (ref 3.5–5.1)
Sodium: 140 mmol/L (ref 135–145)

## 2024-01-26 MED ORDER — OXYCODONE-ACETAMINOPHEN 5-325 MG PO TABS: 1.0000 | ORAL_TABLET | ORAL | 0 refills | Status: AC | PRN

## 2024-01-26 NOTE — Discharge Summary (Signed)
  Patient ID: Albert Walls [id number] 73 y.o. [birth year]  01/25/2024  Discharge date and time: 01/26/2024  Admitting Physician: Deward PARAS Carmalita Wakefield  Discharge Physician: Deward PARAS Yaira Bernardi  Admission Diagnoses: Recurrent ventral hernia [K43.2] Bilateral inguinal hernia without obstruction or gangrene, recurrence not specified [K40.20] Ventral hernia [K43.9] Patient Active Problem List   Diagnosis Date Noted   Ventral hernia 01/25/2024   Inguinal hernia, right 09/10/2023   Personal history of prostate cancer 03/02/2017   Routine general medical examination at a health care facility 08/12/2015   Essential hypertension 10/14/2006     Discharge Diagnoses:  Patient Active Problem List   Diagnosis Date Noted   Ventral hernia 01/25/2024   Inguinal hernia, right 09/10/2023   Personal history of prostate cancer 03/02/2017   Routine general medical examination at a health care facility 08/12/2015   Essential hypertension 10/14/2006    Operations: Procedure(s): REPAIR, HERNIA, VENTRAL, ROBOT-ASSISTED, BILATERAL INGUINAL HERNIA REPAIR WITH MESH, BILATERAL POSTERIOR RECTUS MYOFASCIAL RELEASE, BILATERAL TRANSVERSUS ABDOMINIS RELEASE  Admission Condition: good  Discharged Condition: good  Indication for Admission: Ventral and bilateral inguinal hernias  Hospital Course: Robotic ventral and bilateral inguinal hernia repairs  Consults: None  Significant Diagnostic Studies: None  Treatments: Surgery  Disposition: Home  Patient Instructions:  Allergies as of 01/26/2024   No Known Allergies      Medication List     TAKE these medications    amLODipine  5 MG tablet Commonly known as: NORVASC  Take 1 tablet (5 mg total) by mouth daily.   calcium carbonate 500 MG chewable tablet Commonly known as: TUMS - dosed in mg elemental calcium Chew 2 tablets by mouth daily as needed for indigestion or heartburn.   metoprolol  succinate 50 MG 24 hr tablet Commonly known as:  TOPROL -XL Take 1 tablet (50 mg total) by mouth daily.   oxyCODONE -acetaminophen  5-325 MG tablet Commonly known as: Percocet Take 1 tablet by mouth every 4 (four) hours as needed for severe pain (pain score 7-10).   PRESCRIPTION MEDICATION Apply 1 Application topically daily. Rosacea Triple Cream: Azelaic Acid, Metronidazole and Ivermectin   Systane Complete PF 0.6 % Soln Generic drug: Propylene Glycol (PF) Place 1 drop into both eyes daily as needed (dry eyes).        Activity: no heavy lifting for 4 weeks Diet: regular diet Wound Care: keep wound clean and dry  Follow-up:  With Dr. Lyndel in 4 weeks.  Signed: Deward PARAS Cherree Conerly General, Bariatric, & Minimally Invasive Surgery [facility], [place]   01/26/2024, 7:23 AM

## 2024-01-26 NOTE — Progress Notes (Signed)
 Reviewed written d/c instructions w pt and all questions answered. He verbalized understanding. D/ C via w/c w all belongings in stable condition.

## 2024-01-26 NOTE — Discharge Instructions (Signed)

## 2024-01-26 NOTE — Plan of Care (Signed)

## 2024-01-27 ENCOUNTER — Other Ambulatory Visit: Payer: Self-pay

## 2024-05-04 ENCOUNTER — Encounter: Payer: Self-pay | Admitting: Internal Medicine

## 2024-05-08 ENCOUNTER — Other Ambulatory Visit: Payer: Self-pay

## 2024-05-08 ENCOUNTER — Encounter (HOSPITAL_BASED_OUTPATIENT_CLINIC_OR_DEPARTMENT_OTHER): Payer: Self-pay

## 2024-05-08 ENCOUNTER — Emergency Department (HOSPITAL_BASED_OUTPATIENT_CLINIC_OR_DEPARTMENT_OTHER)
Admission: EM | Admit: 2024-05-08 | Discharge: 2024-05-08 | Disposition: A | Discharge status: Home / Self Care | Attending: Emergency Medicine | Admitting: Emergency Medicine

## 2024-05-08 DIAGNOSIS — I160 Hypertensive urgency: Secondary | ICD-10-CM

## 2024-05-08 DIAGNOSIS — A6920 Lyme disease, unspecified: Secondary | ICD-10-CM

## 2024-05-08 LAB — CBC WITH DIFFERENTIAL/PLATELET
Abs Immature Granulocytes: 0.06 K/uL (ref 0.00–0.07)
Basophils Absolute: 0.1 K/uL (ref 0.0–0.1)
Basophils Relative: 1 %
Eosinophils Absolute: 0.2 K/uL (ref 0.0–0.5)
Eosinophils Relative: 3 %
HCT: 42.8 % (ref 39.0–52.0)
Hemoglobin: 14.5 g/dL (ref 13.0–17.0)
Immature Granulocytes: 1 %
Lymphocytes Relative: 19 %
Lymphs Abs: 1.6 K/uL (ref 0.7–4.0)
MCH: 29.8 pg (ref 26.0–34.0)
MCHC: 33.9 g/dL (ref 30.0–36.0)
MCV: 88.1 fL (ref 80.0–100.0)
Monocytes Absolute: 0.6 K/uL (ref 0.1–1.0)
Monocytes Relative: 7 %
Neutro Abs: 6 K/uL (ref 1.7–7.7)
Neutrophils Relative %: 69 %
Platelets: 301 K/uL (ref 150–400)
RBC: 4.86 MIL/uL (ref 4.22–5.81)
RDW: 13.3 % (ref 11.5–15.5)
WBC: 8.6 K/uL (ref 4.0–10.5)
nRBC: 0 % (ref 0.0–0.2)

## 2024-05-08 LAB — COMPREHENSIVE METABOLIC PANEL WITH GFR
ALT: 26 U/L (ref 0–44)
AST: 24 U/L (ref 15–41)
Albumin: 4.2 g/dL (ref 3.5–5.0)
Alkaline Phosphatase: 85 U/L (ref 38–126)
Anion gap: 9 (ref 5–15)
BUN: 14 mg/dL (ref 8–23)
CO2: 30 mmol/L (ref 22–32)
Calcium: 9.7 mg/dL (ref 8.9–10.3)
Chloride: 102 mmol/L (ref 98–111)
Creatinine, Ser: 0.85 mg/dL (ref 0.61–1.24)
GFR, Estimated: >60 mL/min (ref >60)
Glucose, Bld: 99 mg/dL (ref 70–99)
Potassium: 4 mmol/L (ref 3.5–5.1)
Sodium: 141 mmol/L (ref 135–145)
Total Bilirubin: 0.3 mg/dL (ref 0.0–1.2)
Total Protein: 6.9 g/dL (ref 6.5–8.1)

## 2024-05-08 MED ORDER — DOXYCYCLINE HYCLATE 100 MG PO TABS
100.0000 mg | ORAL_TABLET | Freq: Once | ORAL | Status: AC
  Administered 2024-05-08: 100 mg via ORAL
  Filled 2024-05-08: qty 1

## 2024-05-08 MED ORDER — DOXYCYCLINE HYCLATE 100 MG PO CAPS: 100.0000 mg | ORAL_CAPSULE | Freq: Two times a day (BID) | ORAL | 0 refills | Status: AC

## 2024-05-08 MED ORDER — DOXYCYCLINE HYCLATE 100 MG PO CAPS: 100.0000 mg | ORAL_CAPSULE | Freq: Two times a day (BID) | ORAL | 0 refills | Status: DC

## 2024-05-08 NOTE — ED Notes (Signed)

## 2024-05-08 NOTE — ED Provider Notes (Signed)
 [place] EMERGENCY DEPARTMENT AT [facility] Provider Note   CSN: [id number] Arrival date & time: 05/08/24  1403     Patient presents with: Insect Bite   Albert Walls is a 73 y.o. male.   HPI 73 year old male presents with a rash on his back. In mid November he went hiking/camping and got a tick bite. However, he didn't pull off the tick until about a week later because he and his wife though it might just be a mole, but then started to get engorged and was pulled off. For the past week he's had some itching to the area, and now has 2 days of a ring type rash around the scabbed area. No headaches, vision changes, syncope, weakness, chest pain, dyspnea.   He is noted to be hypertensive here, 208/110 in triage. He suspects this is a combination of poor diet recently with family around as well as he always has HTN when he's in a doctor's office. He is otherwise asymptomatic.  Prior to Admission medications   Medication Sig Start Date End Date Taking? Authorizing Provider  amLODipine  (NORVASC ) 5 MG tablet Take 1 tablet (5 mg total) by mouth daily. 09/07/23   Rollene Almarie LABOR, MD  calcium carbonate (TUMS - DOSED IN MG ELEMENTAL CALCIUM) 500 MG chewable tablet Chew 2 tablets by mouth daily as needed for indigestion or heartburn.    [provider]  doxycycline  (VIBRAMYCIN ) 100 MG capsule Take 1 capsule (100 mg total) by mouth 2 (two) times daily. 05/08/24   Freddi Hamilton, MD  metoprolol  succinate (TOPROL -XL) 50 MG 24 hr tablet Take 1 tablet (50 mg total) by mouth daily. 09/07/23   Rollene Almarie LABOR, MD  oxyCODONE -acetaminophen  (PERCOCET) 5-325 MG tablet Take 1 tablet by mouth every 4 (four) hours as needed for severe pain (pain score 7-10). 01/26/24 01/25/25  Stechschulte, Deward PARAS, MD  PRESCRIPTION MEDICATION Apply 1 Application topically daily. Rosacea Triple Cream: Azelaic Acid, Metronidazole and Ivermectin    [provider]  Propylene Glycol, PF,  (SYSTANE COMPLETE PF) 0.6 % SOLN Place 1 drop into both eyes daily as needed (dry eyes).    [provider]    Allergies: Patient has no known allergies.    Review of Systems  Constitutional:  Negative for fever.  Eyes:  Negative for visual disturbance.  Respiratory:  Negative for shortness of breath.   Cardiovascular:  Negative for chest pain.  Skin:  Positive for rash.  Neurological:  Negative for weakness and headaches.    Updated Vital Signs BP (!) 154/89   Pulse 62   Temp 97.9 F (36.6 C)   Resp 15   Ht 5' 7 (1.702 m)   Wt 86.2 kg   SpO2 99%   BMI 29.76 kg/m   Physical Exam Vitals and nursing note reviewed.  Constitutional:      General: He is not in acute distress.    Appearance: He is well-developed. He is not ill-appearing or diaphoretic.  HENT:     Head: Normocephalic and atraumatic.  Cardiovascular:     Rate and Rhythm: Normal rate and regular rhythm.     Heart sounds: Normal heart sounds.  Pulmonary:     Effort: Pulmonary effort is normal.     Breath sounds: Normal breath sounds.  Abdominal:     Palpations: Abdomen is soft.     Tenderness: There is no abdominal tenderness.  Musculoskeletal:     Cervical back: No rigidity.  Skin:  General: Skin is warm and dry.     Findings: Rash present.      Neurological:     Mental Status: He is alert.     (all labs ordered are listed, but only abnormal results are displayed) Labs Reviewed  COMPREHENSIVE METABOLIC PANEL WITH GFR  CBC WITH DIFFERENTIAL/PLATELET  LYME DISEASE SEROLOGY W/REFLEX    EKG: None  Radiology: No results found.   Procedures   Medications Ordered in the ED  doxycycline  (VIBRA -TABS) tablet 100 mg (100 mg Oral Given 05/08/24 1537)                                    Medical Decision Making Amount and/or Complexity of Data Reviewed Labs: ordered.    Details: Normal creatinine  Risk Prescription drug management.   Patient presents with erythema migrans.  No  other signs or symptoms of a more progressive Lyme disease.  Will put on doxycycline  x 10 days.  He is also noted to have asymptomatic hypertension.  Labs were obtained which are unremarkable.  Blood pressure has come down without any acute intervention.  Can follow-up with PCP for this.  Appears stable for discharge home with return precautions.     Final diagnoses:  Erythema migrans (Lyme disease)  Hypertensive urgency    ED Discharge Orders          Ordered    doxycycline  (VIBRAMYCIN ) 100 MG capsule  2 times daily,   Status:  Discontinued        05/08/24 1528    doxycycline  (VIBRAMYCIN ) 100 MG capsule  2 times daily        05/08/24 1529               Freddi Hamilton, MD 05/08/24 RONOLD

## 2024-05-08 NOTE — ED Triage Notes (Signed)
 Bulls eye mark to right side back behind axilla after tick bite on 04/15/2024. Denies chills, fever, body aches.

## 2024-05-08 NOTE — Discharge Instructions (Addendum)
 We are treating you with 10 days of doxycycline  for Lyme disease.  Follow-up with your primary care provider.  Your blood pressure was quite elevated today, make sure you are taking your blood pressure medicines as prescribed and be sure to adjust diet and exercise accordingly.  You will need to follow-up with your primary care provider for this as well.  If you develop headache, vision changes, fever, weakness, or any other new/concerning symptoms then return to the ER.

## 2024-05-09 LAB — LYME DISEASE SEROLOGY W/REFLEX: Lyme Total Antibody EIA: NEGATIVE

## 2024-09-12 ENCOUNTER — Ambulatory Visit

## 2024-09-12 ENCOUNTER — Encounter: Admitting: Internal Medicine
